# Patient Record
Sex: Male | Born: 1945 | Race: Black or African American | Hispanic: No | Marital: Married | Smoking: Never smoker
Health system: Southern US, Community
[De-identification: ages and names within clinical notes are randomized; demographics above are authoritative.]

## PROBLEM LIST (undated history)

## (undated) DIAGNOSIS — N400 Enlarged prostate without lower urinary tract symptoms: Secondary | ICD-10-CM

## (undated) DIAGNOSIS — R55 Syncope and collapse: Secondary | ICD-10-CM

## (undated) DIAGNOSIS — N289 Disorder of kidney and ureter, unspecified: Secondary | ICD-10-CM

## (undated) DIAGNOSIS — R9431 Abnormal electrocardiogram [ECG] [EKG]: Secondary | ICD-10-CM

## (undated) DIAGNOSIS — K219 Gastro-esophageal reflux disease without esophagitis: Secondary | ICD-10-CM

## (undated) DIAGNOSIS — D496 Neoplasm of unspecified behavior of brain: Secondary | ICD-10-CM

## (undated) DIAGNOSIS — F0390 Unspecified dementia without behavioral disturbance: Secondary | ICD-10-CM

## (undated) DIAGNOSIS — R001 Bradycardia, unspecified: Secondary | ICD-10-CM

## (undated) DIAGNOSIS — R569 Unspecified convulsions: Secondary | ICD-10-CM

## (undated) DIAGNOSIS — E119 Type 2 diabetes mellitus without complications: Secondary | ICD-10-CM

## (undated) DIAGNOSIS — I639 Cerebral infarction, unspecified: Secondary | ICD-10-CM

## (undated) DIAGNOSIS — K5900 Constipation, unspecified: Secondary | ICD-10-CM

## (undated) DIAGNOSIS — D573 Sickle-cell trait: Secondary | ICD-10-CM

## (undated) DIAGNOSIS — R1310 Dysphagia, unspecified: Secondary | ICD-10-CM

## (undated) DIAGNOSIS — I10 Essential (primary) hypertension: Secondary | ICD-10-CM

## (undated) DIAGNOSIS — E785 Hyperlipidemia, unspecified: Secondary | ICD-10-CM

## (undated) DIAGNOSIS — N189 Chronic kidney disease, unspecified: Secondary | ICD-10-CM

## (undated) HISTORY — PX: CRANIOTOMY FOR TUMOR: SUR345

## (undated) HISTORY — DX: Gastro-esophageal reflux disease without esophagitis: K21.9

## (undated) HISTORY — PX: CARDIAC CATHETERIZATION: SHX172

## (undated) HISTORY — DX: Type 2 diabetes mellitus without complications: E11.9

## (undated) HISTORY — DX: Dysphagia, unspecified: R13.10

## (undated) HISTORY — DX: Constipation, unspecified: K59.00

---

## 2014-04-22 ENCOUNTER — Emergency Department (HOSPITAL_COMMUNITY): Payer: Medicare Other

## 2014-04-22 ENCOUNTER — Encounter (HOSPITAL_COMMUNITY): Payer: Self-pay | Admitting: Emergency Medicine

## 2014-04-22 ENCOUNTER — Emergency Department (HOSPITAL_COMMUNITY)
Admission: EM | Admit: 2014-04-22 | Discharge: 2014-04-22 | Disposition: A | Discharge status: Home / Self Care | Payer: Medicare Other | Attending: Emergency Medicine | Admitting: Emergency Medicine

## 2014-04-22 DIAGNOSIS — Z85841 Personal history of malignant neoplasm of brain: Secondary | ICD-10-CM | POA: Diagnosis not present

## 2014-04-22 DIAGNOSIS — E119 Type 2 diabetes mellitus without complications: Secondary | ICD-10-CM | POA: Diagnosis not present

## 2014-04-22 DIAGNOSIS — Z794 Long term (current) use of insulin: Secondary | ICD-10-CM | POA: Diagnosis not present

## 2014-04-22 DIAGNOSIS — R001 Bradycardia, unspecified: Secondary | ICD-10-CM | POA: Diagnosis not present

## 2014-04-22 DIAGNOSIS — Z8673 Personal history of transient ischemic attack (TIA), and cerebral infarction without residual deficits: Secondary | ICD-10-CM | POA: Insufficient documentation

## 2014-04-22 DIAGNOSIS — Z87448 Personal history of other diseases of urinary system: Secondary | ICD-10-CM | POA: Diagnosis not present

## 2014-04-22 DIAGNOSIS — Z9889 Other specified postprocedural states: Secondary | ICD-10-CM | POA: Insufficient documentation

## 2014-04-22 DIAGNOSIS — R079 Chest pain, unspecified: Secondary | ICD-10-CM

## 2014-04-22 DIAGNOSIS — Z880 Allergy status to penicillin: Secondary | ICD-10-CM | POA: Insufficient documentation

## 2014-04-22 DIAGNOSIS — I10 Essential (primary) hypertension: Secondary | ICD-10-CM | POA: Insufficient documentation

## 2014-04-22 DIAGNOSIS — Z79899 Other long term (current) drug therapy: Secondary | ICD-10-CM | POA: Insufficient documentation

## 2014-04-22 DIAGNOSIS — F0390 Unspecified dementia without behavioral disturbance: Secondary | ICD-10-CM | POA: Diagnosis not present

## 2014-04-22 HISTORY — DX: Cerebral infarction, unspecified: I63.9

## 2014-04-22 HISTORY — DX: Abnormal electrocardiogram (ECG) (EKG): R94.31

## 2014-04-22 HISTORY — DX: Hyperlipidemia, unspecified: E78.5

## 2014-04-22 HISTORY — DX: Syncope and collapse: R55

## 2014-04-22 HISTORY — DX: Essential (primary) hypertension: I10

## 2014-04-22 HISTORY — DX: Neoplasm of unspecified behavior of brain: D49.6

## 2014-04-22 HISTORY — DX: Unspecified dementia, unspecified severity, without behavioral disturbance, psychotic disturbance, mood disturbance, and anxiety: F03.90

## 2014-04-22 HISTORY — DX: Disorder of kidney and ureter, unspecified: N28.9

## 2014-04-22 HISTORY — DX: Bradycardia, unspecified: R00.1

## 2014-04-22 HISTORY — DX: Type 2 diabetes mellitus without complications: E11.9

## 2014-04-22 LAB — BASIC METABOLIC PANEL
Anion gap: 7 (ref 5–15)
BUN: 17 mg/dL (ref 6–23)
CALCIUM: 9.6 mg/dL (ref 8.4–10.5)
CO2: 30 mEq/L (ref 19–32)
Chloride: 110 mEq/L (ref 96–112)
Creatinine, Ser: 1.28 mg/dL (ref 0.50–1.35)
GFR, EST AFRICAN AMERICAN: 64 mL/min — AB (ref >90)
GFR, EST NON AFRICAN AMERICAN: 55 mL/min — AB (ref >90)
GLUCOSE: 64 mg/dL — AB (ref 70–99)
POTASSIUM: 4.2 meq/L (ref 3.7–5.3)
SODIUM: 147 meq/L (ref 137–147)

## 2014-04-22 LAB — CBC
HCT: 29.2 % — ABNORMAL LOW (ref 39.0–52.0)
Hemoglobin: 10 g/dL — ABNORMAL LOW (ref 13.0–17.0)
MCH: 27.5 pg (ref 26.0–34.0)
MCHC: 34.2 g/dL (ref 30.0–36.0)
MCV: 80.2 fL (ref 78.0–100.0)
PLATELETS: 90 10*3/uL — AB (ref 150–400)
RBC: 3.64 MIL/uL — ABNORMAL LOW (ref 4.22–5.81)
RDW: 13 % (ref 11.5–15.5)
WBC: 3.2 10*3/uL — ABNORMAL LOW (ref 4.0–10.5)

## 2014-04-22 LAB — I-STAT TROPONIN, ED: TROPONIN I, POC: 0.01 ng/mL (ref 0.00–0.08)

## 2014-04-22 LAB — CBG MONITORING, ED: Glucose-Capillary: 111 mg/dL — ABNORMAL HIGH (ref 70–99)

## 2014-04-22 NOTE — Discharge Instructions (Signed)
Bradycardia °Bradycardia is a term for a heart rate (pulse) that, in adults, is slower than 60 beats per minute. A normal rate is 60 to 100 beats per minute. A heart rate below 60 beats per minute may be normal for some adults with healthy hearts. If the rate is too slow, the heart may have trouble pumping the volume of blood the body needs. If the heart rate gets too low, blood flow to the brain may be decreased and may make you feel lightheaded, dizzy, or faint. °The heart has a natural pacemaker in the top of the heart called the SA node (sinoatrial or sinus node). This pacemaker sends out regular electrical signals to the muscle of the heart, telling the heart muscle when to beat (contract). The electrical signal travels from the upper parts of the heart (atria) through the AV node (atrioventricular node), to the lower chambers of the heart (ventricles). The ventricles squeeze, pumping the blood from your heart to your lungs and to the rest of your body. °CAUSES  °· Problem with the heart's electrical system. °· Problem with the heart's natural pacemaker. °· Heart disease, damage, or infection. °· Medications. °· Problems with minerals and salts (electrolytes). °SYMPTOMS  °· Fainting (syncope). °· Fatigue and weakness. °· Shortness of breath (dyspnea). °· Chest pain (angina). °· Drowsiness. °· Confusion. °DIAGNOSIS  °· An electrocardiogram (ECG) can help your caregiver determine the type of slow heart rate you have. °· If the cause is not seen on an ECG, you may need to wear a heart monitor that records your heart rhythm for several hours or days. °· Blood tests. °TREATMENT  °· Electrolyte supplements. °· Medications. °· Withholding medication which is causing a slow heart rate. °· Pacemaker placement. °SEEK IMMEDIATE MEDICAL CARE IF:  °· You feel lightheaded or faint. °· You develop an irregular heart rate. °· You feel chest pain or have trouble breathing. °MAKE SURE YOU:  °· Understand these  instructions. °· Will watch your condition. °· Will get help right away if you are not doing well or get worse. °Document Released: 03/02/2002 Document Revised: 09/02/2011 Document Reviewed: 09/15/2013 °[employer]® Patient Information ©2015 [employer]. This information is not intended to replace advice given to you by your health care provider. Make sure you discuss any questions you have with your health care provider. ° °

## 2014-04-22 NOTE — ED Provider Notes (Signed)
CSN: [id number]     Arrival date & time 04/22/14  1012 History   First MD Initiated Contact with Patient 04/22/14 1033     Chief Complaint  Patient presents with  . Chest Pain     (Consider location/radiation/quality/duration/timing/severity/associated sxs/prior Treatment) Patient is a 68 y.o. male presenting with chest pain. The history is provided by the spouse.  Chest Pain   Jeffery Schneider is a 68 y.o. male who presents for evaluation of an episode of dizziness. The patient has these symptoms at least twice a week. Sometimes they are accompanied by low heart rate. He saw his doctor 2 days ago, and was put on a cardiac monitor to evaluate the episodes. This morning he also appeared to have chest pain. Chest pain has apparently since, resolved. This information is gained from his wife, who gives the entire history. The patient is not able to contribute to history. He has had a left brain hemorrhagic stroke. The patient is also troubled by "fatigue" that has been going on for a couple of weeks. Sometimes when he is having a dizzy spell. He gets even more fatigued. He reportedly took all his medications, this morning as directed. There have been no other recent events. There are no other known modifying factors.   Past Medical History  Diagnosis Date  . Hypertension   . Diabetes mellitus without complication   . Brain tumor   . Stroke   . Kidney disease   . Hyperlipidemia   . Bradycardia   . Syncope   . Abnormal EKG   . Dementia    Past Surgical History  Procedure Laterality Date  . Cardiac catheterization    . Craniotomy for tumor      frontal lobe   No family history on file. History  Substance Use Topics  . Smoking status: Never Smoker   . Smokeless tobacco: Not on file  . Alcohol Use: No    Review of Systems  Cardiovascular: Positive for chest pain.  All other systems reviewed and are negative.     Allergies  Ace inhibitors; Calcitonin; Influenza vaccines;  Lisinopril; Loratadine; Penicillins; Sulfa antibiotics; Actos; Codeine phosphate; and Streptomycin  Home Medications   Prior to Admission medications   Medication Sig Start Date End Date Taking? Authorizing Provider  insulin aspart (NOVOLOG) 100 UNIT/ML injection Inject 6-7 Units into the skin 3 (three) times daily before meals. Take 7 units every morning and 6 units at lunch and bedtime   Yes Historical Provider, MD  insulin glargine (LANTUS) 100 UNIT/ML injection Inject 12 Units into the skin at bedtime.   Yes Historical Provider, MD  levETIRAcetam (KEPPRA) 500 MG tablet Take 1,000 mg by mouth 2 (two) times daily.   Yes Historical Provider, MD  losartan (COZAAR) 100 MG tablet Take 100 mg by mouth daily.   Yes Historical Provider, MD  magnesium oxide (MAG-OX) 400 MG tablet Take 800 mg by mouth 2 (two) times daily.   Yes Historical Provider, MD  Melatonin 5 MG TABS Take 10 mg by mouth at bedtime.   Yes Historical Provider, MD  sertraline (ZOLOFT) 25 MG tablet Take 25 mg by mouth at bedtime.   Yes Historical Provider, MD  terazosin (HYTRIN) 2 MG capsule Take 2 mg by mouth at bedtime.   Yes Historical Provider, MD   BP 130/52 mmHg  Pulse 45  Temp(Src) 97.8 F (36.6 C) (Oral)  Resp 13  Ht 5\' 10"  (1.778 m)  Wt 177 lb (80.287 kg)  BMI 25.40  kg/m2  SpO2 98% Physical Exam  Nursing note and vitals reviewed. Constitutional: He appears well-developed and well-nourished.  HENT:  Head: Normocephalic.  Right Ear: External ear normal.  Left Ear: External ear normal.  Prior craniotomy scar is present. Left side  Eyes: Conjunctivae and EOM are normal. Pupils are equal, round, and reactive to light.  Neck: Normal range of motion and phonation normal. Neck supple.  Cardiovascular: Normal rate, regular rhythm and normal heart sounds.   Pulmonary/Chest: Effort normal and breath sounds normal. He exhibits no bony tenderness.  Abdominal: Soft. There is no tenderness.  Musculoskeletal: Normal range of  motion.  Neurological: He is alert. No cranial nerve deficit or sensory deficit. He exhibits normal muscle tone.  Nonsensical speech. He is alert, and cooperative. He is able to do strength testing. The strength is symmetric, bilaterally 3-4 over 5.   Skin: Skin is warm, dry and intact.  Psychiatric: He has a normal mood and affect. His behavior is normal.    ED Course  Procedures (including critical care time) Medications - No data to display  No data found.  External HR monitor was interrogated by contacting the vendor- 2 episodes of bradycardia in mid-50's without other arrhythymia  I discussed the case with Dr. Einar Gip, his cardiologist; no further evaluation or treatment suggested.   Labs Review Labs Reviewed  CBC - Abnormal; Notable for the following:    WBC 3.2 (*)    RBC 3.64 (*)    Hemoglobin 10.0 (*)    HCT 29.2 (*)    Platelets 90 (*)    All other components within normal limits  BASIC METABOLIC PANEL - Abnormal; Notable for the following:    Glucose, Bld 64 (*)    GFR calc non Af Amer 55 (*)    GFR calc Af Amer 64 (*)    All other components within normal limits  CBG MONITORING, ED - Abnormal; Notable for the following:    Glucose-Capillary 111 (*)    All other components within normal limits  I-STAT TROPOININ, ED    Imaging Review No results found.   EKG Interpretation   Date/Time:  Friday April 22 2014 10:15:06 EDT Ventricular Rate:  54 PR Interval:  182 QRS Duration: 84 QT Interval:  431 QTC Calculation: 408 R Axis:   -24 Text Interpretation:  Sinus rhythm Atrial premature complexes Borderline  left axis deviation No old tracing to compare Confirmed by [facility]  MD,  Shandra Szymborski ([id number]) on 04/22/2014 10:35:27 AM      MDM   Final diagnoses:  Bradycardia    Malaise with bradycardia. No SBI, metabolic instability or impending vascular collapse.  Nursing Notes Reviewed/ Care Coordinated Applicable Imaging Reviewed Interpretation of Laboratory  Data incorporated into ED treatment  The patient appears reasonably screened and/or stabilized for discharge and I doubt any other medical condition or other [facility] requiring further screening, evaluation, or treatment in the ED at this time prior to discharge.  Plan: Home Medications- usual; Home Treatments- rest; return here if the recommended treatment, does not improve the symptoms; Recommended follow up- PCP and Cardiology as usual    Richarda Blade, MD 04/24/14 1444

## 2014-04-22 NOTE — ED Notes (Addendum)
Waiting on transport for xray.

## 2014-04-22 NOTE — ED Notes (Signed)
Patient transported to X-ray 

## 2014-04-22 NOTE — ED Notes (Signed)
Per [facility], pt was eating breakfast this morning and started having epigastric pain that felt like a pressure. Was nauseated, no other symptoms. Pt not rating pain. Denies any pain while in route with EMS and at present. Not given any NTG or ASA. No ASA d/t brain tumor, and refused IV and NTG. Pt with hx of dementia.

## 2014-04-22 NOTE — ED Notes (Signed)
Patient returned to room, placed on monitor

## 2014-04-22 NOTE — ED Notes (Signed)
Dr. Wentz at the bedside.  

## 2014-05-18 ENCOUNTER — Encounter: Payer: Self-pay | Admitting: Gastroenterology

## 2014-07-19 ENCOUNTER — Ambulatory Visit (INDEPENDENT_AMBULATORY_CARE_PROVIDER_SITE_OTHER): Payer: 59 | Admitting: Gastroenterology

## 2014-07-19 ENCOUNTER — Encounter: Payer: Self-pay | Admitting: Gastroenterology

## 2014-07-19 DIAGNOSIS — R079 Chest pain, unspecified: Secondary | ICD-10-CM | POA: Insufficient documentation

## 2014-07-19 DIAGNOSIS — R0789 Other chest pain: Secondary | ICD-10-CM

## 2014-07-19 DIAGNOSIS — R1310 Dysphagia, unspecified: Secondary | ICD-10-CM | POA: Insufficient documentation

## 2014-07-19 DIAGNOSIS — K5900 Constipation, unspecified: Secondary | ICD-10-CM | POA: Insufficient documentation

## 2014-07-19 DIAGNOSIS — E119 Type 2 diabetes mellitus without complications: Secondary | ICD-10-CM | POA: Insufficient documentation

## 2014-07-19 NOTE — Assessment & Plan Note (Signed)
Stage of may be representative of if occult these with deglutition.  A esophageal stricture should be ruled out.   Recommendations #1 upper endoscopy with dilation as indicated

## 2014-07-19 NOTE — Patient Instructions (Signed)

## 2014-07-19 NOTE — Assessment & Plan Note (Signed)
By history the patient has noncardiac chest pain.  This may be due to reflux or esophageal mucosal disease including Candida esophagitis.  Recommendations #1 continue Protonix #2 upper endoscopy

## 2014-07-19 NOTE — Assessment & Plan Note (Signed)
Tendons are fairly well-controlled with MiraLAX.

## 2014-07-19 NOTE — Progress Notes (Signed)
_                                                                                                                History of Present Illness:  [name] is a 69 year old Afro-American male with history of a meningioma, status post resection complicated by a hemorrhagic stroke, referred for evaluation of chest pain and possibly dysphagia.  History was obtained through the patient's spouse.  The patient is not very communicative.  He has had cognitive difficulties since his stroke.  He was seen at [facility] approximately 3 months ago for chest pain.  It was felt that pain was non-cardiac and he was placed on Protonix.  He's had occasional episodes of less severe pain since.  He has some difficulty with swallowing.  His wife is not certain whether this is dysphagia or problems with deglutition.  Weight has been stable.  Probably underwent colonoscopy in 2012.  He has had a gastrostomy tube in the past.  Lab in October, 2015 was pertinent for hemoglobin 10 and MCV 80.  He complains of constipation.   Past Medical History  Diagnosis Date  . Hypertension   . Diabetes mellitus without complication   . Brain tumor   . Stroke   . Kidney disease   . Hyperlipidemia   . Bradycardia   . Syncope   . Abnormal EKG   . Dementia   . GERD (gastroesophageal reflux disease)   . Dysphagia   . Diabetes   . Constipation    Past Surgical History  Procedure Laterality Date  . Cardiac catheterization    . Craniotomy for tumor      frontal lobe   family history includes Diabetes in his sister; Heart attack in his brother, father, and mother; Thyroid cancer in his sister. Current Outpatient Prescriptions  Medication Sig Dispense Refill  . donepezil (ARICEPT) 5 MG tablet Take 5 mg by mouth at bedtime.    . insulin aspart (NOVOLOG) 100 UNIT/ML injection Inject 6-7 Units into the skin 3 (three) times daily before meals. Take 7 units every morning and 6 units at lunch and bedtime    .  insulin glargine (LANTUS) 100 UNIT/ML injection Inject 12 Units into the skin at bedtime.    . levETIRAcetam (KEPPRA) 500 MG tablet Take 1,000 mg by mouth 2 (two) times daily.    Marland Kitchen losartan (COZAAR) 100 MG tablet Take 100 mg by mouth daily.    . magnesium oxide (MAG-OX) 400 MG tablet Take 800 mg by mouth 2 (two) times daily.    . Melatonin 5 MG TABS Take 10 mg by mouth at bedtime.    . sertraline (ZOLOFT) 25 MG tablet Take 25 mg by mouth at bedtime.    Marland Kitchen terazosin (HYTRIN) 2 MG capsule Take 2 mg by mouth at bedtime.     No current facility-administered medications for this visit.   Allergies as of 07/19/2014 - Review Complete 07/19/2014  Allergen Reaction Noted  . Ace inhibitors  04/22/2014  .  Calcitonin  04/22/2014  . Influenza vaccines Nausea And Vomiting 04/22/2014  . Lisinopril Other (See Comments) 04/22/2014  . Loratadine Itching 04/22/2014  . Penicillins Other (See Comments) 04/22/2014  . Sulfa antibiotics Other (See Comments) 04/22/2014  . Actos [pioglitazone] Itching and Rash 04/22/2014  . Codeine phosphate Rash 04/22/2014  . Streptomycin Rash 04/22/2014    reports that he has never smoked. He does not have any smokeless tobacco history on file. He reports that he does not drink alcohol or use illicit drugs.   Review of Systems: He has lower extremity weakness and is not ambulatory Pertinent positive and negative review of systems were noted in the above HPI section. All other review of systems were otherwise negative.  Vital signs were reviewed in today's medical record Physical Exam: General: Well developed , well nourished, no acute distress Skin: anicteric Head: Normocephalic and atraumatic Eyes:  sclerae anicteric, EOMI Ears: Normal auditory acuity Mouth: No deformity or lesions Neck: Supple, no masses or thyromegaly Lungs: Clear throughout to auscultation Heart: Regular rate and rhythm; no  rubs or bruits.  There is a 1/6 early systolic murmur Abdomen: Soft, non  tender and non distended. No masses, hepatosplenomegaly or hernias noted. Normal Bowel sounds Rectal:deferred Musculoskeletal: Symmetrical with no gross deformities  Skin: No lesions on visible extremities Pulses:  Normal pulses noted Extremities: No clubbing, cyanosis, edema or deformities noted Neurological: Alert oriented x 4, grossly nonfocal Cervical Nodes:  No significant cervical adenopathy Inguinal Nodes: No significant inguinal adenopathy Psychological:  Alert and cooperative. Normal mood and affect  See Assessment and Plan under Problem List

## 2014-08-01 ENCOUNTER — Encounter: Payer: TRICARE For Life (TFL) | Admitting: Gastroenterology

## 2014-11-23 ENCOUNTER — Observation Stay (HOSPITAL_COMMUNITY)
Admission: EM | Admit: 2014-11-23 | Discharge: 2014-11-24 | Disposition: A | Discharge status: Home / Self Care | Payer: Medicare PPO | Attending: Internal Medicine | Admitting: Internal Medicine

## 2014-11-23 ENCOUNTER — Observation Stay (HOSPITAL_COMMUNITY): Payer: Medicare PPO

## 2014-11-23 ENCOUNTER — Emergency Department (HOSPITAL_COMMUNITY): Payer: Medicare PPO

## 2014-11-23 ENCOUNTER — Encounter (HOSPITAL_COMMUNITY): Payer: Self-pay | Admitting: Family Medicine

## 2014-11-23 ENCOUNTER — Inpatient Hospital Stay (HOSPITAL_COMMUNITY): Payer: Medicare PPO

## 2014-11-23 DIAGNOSIS — Z888 Allergy status to other drugs, medicaments and biological substances status: Secondary | ICD-10-CM | POA: Diagnosis not present

## 2014-11-23 DIAGNOSIS — N189 Chronic kidney disease, unspecified: Secondary | ICD-10-CM | POA: Diagnosis not present

## 2014-11-23 DIAGNOSIS — R5383 Other fatigue: Secondary | ICD-10-CM

## 2014-11-23 DIAGNOSIS — Z8673 Personal history of transient ischemic attack (TIA), and cerebral infarction without residual deficits: Secondary | ICD-10-CM | POA: Insufficient documentation

## 2014-11-23 DIAGNOSIS — Z79899 Other long term (current) drug therapy: Secondary | ICD-10-CM | POA: Diagnosis not present

## 2014-11-23 DIAGNOSIS — Z885 Allergy status to narcotic agent status: Secondary | ICD-10-CM | POA: Insufficient documentation

## 2014-11-23 DIAGNOSIS — N183 Chronic kidney disease, stage 3 (moderate): Secondary | ICD-10-CM

## 2014-11-23 DIAGNOSIS — K5900 Constipation, unspecified: Secondary | ICD-10-CM | POA: Diagnosis present

## 2014-11-23 DIAGNOSIS — K219 Gastro-esophageal reflux disease without esophagitis: Secondary | ICD-10-CM | POA: Diagnosis not present

## 2014-11-23 DIAGNOSIS — E1129 Type 2 diabetes mellitus with other diabetic kidney complication: Secondary | ICD-10-CM

## 2014-11-23 DIAGNOSIS — I129 Hypertensive chronic kidney disease with stage 1 through stage 4 chronic kidney disease, or unspecified chronic kidney disease: Secondary | ICD-10-CM | POA: Diagnosis not present

## 2014-11-23 DIAGNOSIS — Z880 Allergy status to penicillin: Secondary | ICD-10-CM | POA: Insufficient documentation

## 2014-11-23 DIAGNOSIS — R001 Bradycardia, unspecified: Secondary | ICD-10-CM | POA: Insufficient documentation

## 2014-11-23 DIAGNOSIS — R5381 Other malaise: Secondary | ICD-10-CM

## 2014-11-23 DIAGNOSIS — I4891 Unspecified atrial fibrillation: Secondary | ICD-10-CM | POA: Diagnosis not present

## 2014-11-23 DIAGNOSIS — R569 Unspecified convulsions: Secondary | ICD-10-CM | POA: Diagnosis not present

## 2014-11-23 DIAGNOSIS — R1310 Dysphagia, unspecified: Secondary | ICD-10-CM

## 2014-11-23 DIAGNOSIS — G459 Transient cerebral ischemic attack, unspecified: Secondary | ICD-10-CM | POA: Insufficient documentation

## 2014-11-23 DIAGNOSIS — E1122 Type 2 diabetes mellitus with diabetic chronic kidney disease: Secondary | ICD-10-CM | POA: Diagnosis not present

## 2014-11-23 DIAGNOSIS — Z794 Long term (current) use of insulin: Secondary | ICD-10-CM | POA: Insufficient documentation

## 2014-11-23 DIAGNOSIS — E119 Type 2 diabetes mellitus without complications: Secondary | ICD-10-CM | POA: Diagnosis not present

## 2014-11-23 DIAGNOSIS — R531 Weakness: Secondary | ICD-10-CM

## 2014-11-23 DIAGNOSIS — Z882 Allergy status to sulfonamides status: Secondary | ICD-10-CM | POA: Diagnosis not present

## 2014-11-23 DIAGNOSIS — G40909 Epilepsy, unspecified, not intractable, without status epilepticus: Secondary | ICD-10-CM | POA: Diagnosis not present

## 2014-11-23 DIAGNOSIS — E785 Hyperlipidemia, unspecified: Secondary | ICD-10-CM | POA: Insufficient documentation

## 2014-11-23 DIAGNOSIS — I10 Essential (primary) hypertension: Secondary | ICD-10-CM | POA: Diagnosis not present

## 2014-11-23 DIAGNOSIS — F0390 Unspecified dementia without behavioral disturbance: Secondary | ICD-10-CM | POA: Diagnosis not present

## 2014-11-23 HISTORY — DX: Unspecified convulsions: R56.9

## 2014-11-23 LAB — COMPREHENSIVE METABOLIC PANEL
ALT: 25 U/L (ref 17–63)
AST: 22 U/L (ref 15–41)
Albumin: 3.7 g/dL (ref 3.5–5.0)
Alkaline Phosphatase: 74 U/L (ref 38–126)
Anion gap: 10 (ref 5–15)
BILIRUBIN TOTAL: 0.9 mg/dL (ref 0.3–1.2)
BUN: 18 mg/dL (ref 6–20)
CO2: 24 mmol/L (ref 22–32)
Calcium: 9.1 mg/dL (ref 8.9–10.3)
Chloride: 104 mmol/L (ref 101–111)
Creatinine, Ser: 1.79 mg/dL — ABNORMAL HIGH (ref 0.61–1.24)
GFR calc non Af Amer: 37 mL/min — ABNORMAL LOW (ref >60)
GFR, EST AFRICAN AMERICAN: 43 mL/min — AB (ref >60)
Glucose, Bld: 380 mg/dL — ABNORMAL HIGH (ref 65–99)
POTASSIUM: 4.1 mmol/L (ref 3.5–5.1)
Sodium: 138 mmol/L (ref 135–145)
Total Protein: 6.4 g/dL — ABNORMAL LOW (ref 6.5–8.1)

## 2014-11-23 LAB — CBC
HCT: 30.8 % — ABNORMAL LOW (ref 39.0–52.0)
Hemoglobin: 10.7 g/dL — ABNORMAL LOW (ref 13.0–17.0)
MCH: 27.1 pg (ref 26.0–34.0)
MCHC: 34.7 g/dL (ref 30.0–36.0)
MCV: 78 fL (ref 78.0–100.0)
PLATELETS: 88 10*3/uL — AB (ref 150–400)
RBC: 3.95 MIL/uL — AB (ref 4.22–5.81)
RDW: 13.2 % (ref 11.5–15.5)
WBC: 5.1 10*3/uL (ref 4.0–10.5)

## 2014-11-23 LAB — ETHANOL

## 2014-11-23 LAB — I-STAT CHEM 8, ED
BUN: 20 mg/dL (ref 6–20)
CREATININE: 1.8 mg/dL — AB (ref 0.61–1.24)
Calcium, Ion: 1.19 mmol/L (ref 1.13–1.30)
Chloride: 103 mmol/L (ref 101–111)
Glucose, Bld: 380 mg/dL — ABNORMAL HIGH (ref 65–99)
HEMATOCRIT: 34 % — AB (ref 39.0–52.0)
Hemoglobin: 11.6 g/dL — ABNORMAL LOW (ref 13.0–17.0)
POTASSIUM: 4.1 mmol/L (ref 3.5–5.1)
SODIUM: 139 mmol/L (ref 135–145)
TCO2: 21 mmol/L (ref 0–100)

## 2014-11-23 LAB — GLUCOSE, CAPILLARY
Glucose-Capillary: 399 mg/dL — ABNORMAL HIGH (ref 65–99)
Glucose-Capillary: 196 mg/dL — ABNORMAL HIGH (ref 65–99)
Glucose-Capillary: 94 mg/dL (ref 65–99)

## 2014-11-23 LAB — CBG MONITORING, ED: Glucose-Capillary: 354 mg/dL — ABNORMAL HIGH (ref 65–99)

## 2014-11-23 LAB — DIFFERENTIAL
Basophils Absolute: 0 10*3/uL (ref 0.0–0.1)
Basophils Relative: 0 % (ref 0–1)
EOS PCT: 3 % (ref 0–5)
Eosinophils Absolute: 0.2 10*3/uL (ref 0.0–0.7)
Lymphocytes Relative: 25 % (ref 12–46)
Lymphs Abs: 1.3 10*3/uL (ref 0.7–4.0)
MONOS PCT: 6 % (ref 3–12)
Monocytes Absolute: 0.3 10*3/uL (ref 0.1–1.0)
NEUTROS ABS: 3.3 10*3/uL (ref 1.7–7.7)
NEUTROS PCT: 66 % (ref 43–77)

## 2014-11-23 LAB — URINALYSIS, ROUTINE W REFLEX MICROSCOPIC
BILIRUBIN URINE: NEGATIVE
Ketones, ur: 15 mg/dL — AB
Leukocytes, UA: NEGATIVE
Nitrite: NEGATIVE
Protein, ur: 30 mg/dL — AB
SPECIFIC GRAVITY, URINE: 1.019 (ref 1.005–1.030)
UROBILINOGEN UA: 0.2 mg/dL (ref 0.0–1.0)
pH: 7 (ref 5.0–8.0)

## 2014-11-23 LAB — APTT: aPTT: 26 seconds (ref 24–37)

## 2014-11-23 LAB — URINE MICROSCOPIC-ADD ON

## 2014-11-23 LAB — RAPID URINE DRUG SCREEN, HOSP PERFORMED
AMPHETAMINES: NOT DETECTED
Barbiturates: NOT DETECTED
Benzodiazepines: NOT DETECTED
Cocaine: NOT DETECTED
Opiates: NOT DETECTED
Tetrahydrocannabinol: NOT DETECTED

## 2014-11-23 LAB — PROTIME-INR
INR: 1.12 (ref 0.00–1.49)
Prothrombin Time: 14.6 seconds (ref 11.6–15.2)

## 2014-11-23 LAB — I-STAT TROPONIN, ED: TROPONIN I, POC: 0 ng/mL (ref 0.00–0.08)

## 2014-11-23 MED ORDER — SERTRALINE HCL 50 MG PO TABS
50.0000 mg | ORAL_TABLET | Freq: Every day | ORAL | Status: DC
  Administered 2014-11-23: 50 mg via ORAL
  Filled 2014-11-23: qty 1

## 2014-11-23 MED ORDER — INSULIN ASPART 100 UNIT/ML ~~LOC~~ SOLN
0.0000 [IU] | Freq: Three times a day (TID) | SUBCUTANEOUS | Status: DC
  Administered 2014-11-23: 15 [IU] via SUBCUTANEOUS
  Administered 2014-11-24: 3 [IU] via SUBCUTANEOUS

## 2014-11-23 MED ORDER — LORAZEPAM 2 MG/ML IJ SOLN: 1.0000 mg | Freq: Once | INTRAMUSCULAR | Status: AC | PRN

## 2014-11-23 MED ORDER — ASPIRIN 81 MG PO CHEW
81.0000 mg | CHEWABLE_TABLET | Freq: Every day | ORAL | Status: DC
  Filled 2014-11-23: qty 1

## 2014-11-23 MED ORDER — TERAZOSIN HCL 2 MG PO CAPS
2.0000 mg | ORAL_CAPSULE | Freq: Every day | ORAL | Status: DC
  Administered 2014-11-23: 2 mg via ORAL
  Filled 2014-11-23 (×2): qty 1

## 2014-11-23 MED ORDER — STROKE: EARLY STAGES OF RECOVERY BOOK
Freq: Once | Status: AC
  Administered 2014-11-23: 19:00:00
  Filled 2014-11-23: qty 1

## 2014-11-23 MED ORDER — PANTOPRAZOLE SODIUM 40 MG PO TBEC
40.0000 mg | DELAYED_RELEASE_TABLET | Freq: Every day | ORAL | Status: DC
  Administered 2014-11-24: 40 mg via ORAL
  Filled 2014-11-23: qty 1

## 2014-11-23 MED ORDER — INSULIN GLARGINE 100 UNIT/ML ~~LOC~~ SOLN
12.0000 [IU] | Freq: Every day | SUBCUTANEOUS | Status: DC
  Administered 2014-11-23: 12 [IU] via SUBCUTANEOUS
  Filled 2014-11-23 (×2): qty 0.12

## 2014-11-23 MED ORDER — FINASTERIDE 5 MG PO TABS
5.0000 mg | ORAL_TABLET | Freq: Every day | ORAL | Status: DC
  Administered 2014-11-24: 5 mg via ORAL
  Filled 2014-11-23: qty 1

## 2014-11-23 MED ORDER — SODIUM CHLORIDE 0.9 % IV SOLN
INTRAVENOUS | Status: AC
  Administered 2014-11-23: 16:00:00 via INTRAVENOUS

## 2014-11-23 MED ORDER — POLYETHYLENE GLYCOL 3350 17 G PO PACK
17.0000 g | PACK | Freq: Every day | ORAL | Status: DC
  Administered 2014-11-23 – 2014-11-24 (×2): 17 g via ORAL
  Filled 2014-11-23 (×2): qty 1

## 2014-11-23 MED ORDER — HYDRALAZINE HCL 20 MG/ML IJ SOLN: 10.0000 mg | Freq: Four times a day (QID) | INTRAMUSCULAR | Status: DC | PRN

## 2014-11-23 MED ORDER — LEVETIRACETAM 750 MG PO TABS
750.0000 mg | ORAL_TABLET | Freq: Three times a day (TID) | ORAL | Status: DC
  Administered 2014-11-23 – 2014-11-24 (×4): 750 mg via ORAL
  Filled 2014-11-23 (×4): qty 1

## 2014-11-23 MED ORDER — DOCUSATE SODIUM 100 MG PO CAPS
100.0000 mg | ORAL_CAPSULE | Freq: Two times a day (BID) | ORAL | Status: DC
  Administered 2014-11-23 – 2014-11-24 (×2): 100 mg via ORAL
  Filled 2014-11-23 (×2): qty 1

## 2014-11-23 NOTE — Procedures (Signed)
EEG report.  Brief clinical history: 69 y.o. male presenting to ED with transient AMS in setting of bradycardia. Symptoms resolved on arrival to ED.  Technique: this is a 17 channel routine scalp EEG performed at the bedside with bipolar and monopolar montages arranged in accordance to the international 10/20 system of electrode placement. One channel was dedicated to EKG recording.  No sleep was recorded. No activating procedures performed.  Description:In the wakeful state, the best background consisted of a medium amplitude, posterior dominant, well sustained, symmetric and reactive 8 Hz rhythm. No focal or generalized epileptiform discharges noted.  No pathologic areas of slowing seen.  EKG showed irregular rhythm.  Impression: this is a normal awake EEG. Please, be aware that a normal EEG does not exclude the possibility of epilepsy.  Clinical correlation is advised.   Dorian Pod, MD [facility]

## 2014-11-23 NOTE — H&P (Signed)
[facility] History and Physical  [name] DOB: 01-24-1946 DOA: 11/23/2014  Referring physician: Quincy Carnes. PCP: [name], MD   Chief Complaint: Period of Unresponsiveness.  HPI: Jeffery Schneider is a 69 y.o. male with a past medical history of seizures, hemorrhagic stroke, dementia, dysphagia, and meningioma resection who presents for a suspected seizure this morning. The history was provided by his wife. The patient's last known normal was this morning at 9:37. His wife states that he seemed agitated before she showered him at 9:45am. He was standing in the shower and was being assisted by his wife. His wife states that the patient's body began to slump, he grasped the towel rack and shower bar, was non-responsive, had a fixed gaze, and started to go down.  He then began vomiting, drooling, and loss bladder control. His wife took his pulse and states it was 30 bpm. The ambulance was called.  Per Jeffery Schneider his normal pulse rate is 45 - 50.   Jeffery Schneider for a retinopathy operation today, to correct increased pressure behind his eyes. The patient's wife denied shaking, loss of bowels, tongue biting, or missed doses of keppra.   He cannot have heparin or lovenox due to his hemorrhagic stroke.  Patient has been seen by neurology who recommended stroke and seizure workup.   Review of Systems:  Constitutional: No weight loss, night sweats, Fevers, chills, fatigue.  HEENT: No headaches, Difficulty swallowing,Tooth/dental problems,Sore throat,  No sneezing, itching, ear ache, nasal congestion, post nasal drip,  Cardio-vascular: No chest pain, Orthopnea, PND, swelling in lower extremities, anasarca, dizziness, palpitations  GI: No heartburn, indigestion, abdominal pain, nausea, vomiting, diarrhea, change in bowel habits, loss of appetite  Resp: No shortness of breath with exertion or at rest. No excess mucus, no productive cough, No non-productive cough, No  coughing up of blood.No change in color of mucus.No wheezing.No chest wall deformity  Skin: no rash or lesions.  GU: no dysuria, change in color of urine, no urgency or frequency. No flank pain.  Musculoskeletal: No joint pain or swelling. No decreased range of motion. No back pain.  Psych: No change in mood or affect. No depression or anxiety. No memory loss. His moods change day by day.  Past Medical History  Diagnosis Date  . Hypertension   . Diabetes mellitus without complication   . Brain tumor   . Stroke   . Kidney disease   . Hyperlipidemia   . Bradycardia   . Syncope   . Abnormal EKG   . Dementia   . GERD (gastroesophageal reflux disease)   . Dysphagia   . Diabetes   . Constipation   . Seizures    Past Surgical History  Procedure Laterality Date  . Cardiac catheterization    . Craniotomy for tumor      frontal lobe   Social History:  reports that he has never smoked. He does not have any smokeless tobacco history on file. He reports that he does not drink alcohol or use illicit drugs.   Lives at home with wife.  Needs assistance with ADLs.  Able to stand and walk.  Also has an aid that comes to help care for him daily.  Allergies  Allergen Reactions  . Ace Inhibitors   . Calcitonin   . Influenza Vaccines Nausea And Vomiting  . Lisinopril Other (See Comments)    Sees black spots  . Loratadine Itching  . Penicillins Other (See Comments)  Fainting.   . Sulfa Antibiotics Other (See Comments)    Fainting.   . Actos [Pioglitazone] Itching and Rash  . Codeine Rash  . Streptomycin Rash    Family History  Problem Relation Age of Onset  . Heart attack Mother   . Heart attack Father   . Thyroid cancer Sister   . Diabetes Sister   . Heart attack Brother     Prior to Admission medications   Medication Sig Start Date End Date Taking? Authorizing Provider  amLODipine (NORVASC) 10 MG tablet Take 10 mg by mouth daily.   Yes Historical Provider, MD   Cholecalciferol (VITAMIN D) 2000 UNITS CAPS Take 2,000 Units by mouth 2 (two) times a week.   Yes Historical Provider, MD  finasteride (PROSCAR) 5 MG tablet Take 5 mg by mouth daily. 11/17/14  Yes Historical Provider, MD  insulin aspart (NOVOLOG) 100 UNIT/ML injection Inject 6-7 Units into the skin 3 (three) times daily before meals. Sliding scale: 151-200=1 unit, 201-250=2 unit, 251-300=3 units, 301-350=4 units, 351-400=5 units, over 400 call dr   Yes Historical Provider, MD  insulin glargine (LANTUS) 100 UNIT/ML injection Inject 12 Units into the skin at bedtime.   Yes Historical Provider, MD  levETIRAcetam (KEPPRA) 750 MG tablet Take 750 mg by mouth 3 (three) times daily.   Yes Historical Provider, MD  losartan (COZAAR) 100 MG tablet Take 100 mg by mouth daily.   Yes Historical Provider, MD  magnesium oxide (MAG-OX) 400 MG tablet Take 800 mg by mouth 2 (two) times daily.   Yes Historical Provider, MD  Melatonin 2.5 MG CAPS Take 2.5 mg by mouth 2 (two) times daily.   Yes Historical Provider, MD  nystatin (MYCOSTATIN/NYSTOP) 100000 UNIT/GM POWD Apply to body daily after shower to remove moisture   Yes Historical Provider, MD  pantoprazole (PROTONIX) 40 MG tablet Take 40 mg by mouth daily.   Yes Historical Provider, MD  sertraline (ZOLOFT) 100 MG tablet Take 50 mg by mouth at bedtime.   Yes Historical Provider, MD  terazosin (HYTRIN) 2 MG capsule Take 2 mg by mouth at bedtime.   Yes Historical Provider, MD   Physical Exam: Filed Vitals:   11/23/14 1415 11/23/14 1424 11/23/14 1430 11/23/14 1545  BP: 155/75 155/75 155/70 167/61  Pulse: 66 77 52   Temp:    97.5 F (36.4 C)  TempSrc:    Oral  Resp: 23 11 19 20   SpO2: 96% 100% 94% 97%    Wt Readings from Last 3 Encounters:  07/19/14 82.101 kg (181 lb)  04/22/14 80.287 kg (177 lb)    General:  Wd, Wn, Appears calm and comfortable, smiling.   Eyes: PERRL, normal lids, irises & conjunctiva ENT: grossly normal hearing, lips & tongue Neck: no  LAD, masses or thyromegaly Cardiovascular: RRR, no m/r/g. No LE edema. Telemetry: SR, no arrhythmias  Respiratory: CTA bilaterally, no w/r/r. Normal respiratory effort. Abdomen: soft, ntnd Skin: no rash or induration seen on limited exam Musculoskeletal: grossly normal tone BUE/BLE Psychiatric: Orientated to Person.  Not always orientated to place at baseline. Neurologic: Dependent on wife, Repeats back to me what I've said, but does not appear to be able to answer questions.          Labs on Admission:  Basic Metabolic Panel:  Recent Labs Lab 11/23/14 1040 11/23/14 1050  NA 138 139  K 4.1 4.1  CL 104 103  CO2 24  --   GLUCOSE 380* 380*  BUN 18 20  CREATININE  1.79* 1.80*  CALCIUM 9.1  --    Liver Function Tests:  Recent Labs Lab 11/23/14 1040  AST 22  ALT 25  ALKPHOS 74  BILITOT 0.9  PROT 6.4*  ALBUMIN 3.7   CBC:  Recent Labs Lab 11/23/14 1040 11/23/14 1050  WBC 5.1  --   NEUTROABS 3.3  --   HGB 10.7* 11.6*  HCT 30.8* 34.0*  MCV 78.0  --   PLT 88*  --     CBG:  Recent Labs Lab 11/23/14 1255  GLUCAP 354*    Radiological Exams on Admission: Ct Head Wo Contrast  11/23/2014   CLINICAL DATA:  Slurred speech and facial drooping  EXAM: CT HEAD WITHOUT CONTRAST  TECHNIQUE: Contiguous axial images were obtained from the base of the skull through the vertex without intravenous contrast.  COMPARISON:  Nov 15, 2014  FINDINGS: Patient is status post frontal craniotomy. Ventricles are prominent with ex vacuo phenomenon involving the anterior aspect the left lateral ventricle, stable. The sulci are within normal limits for age. There is no intracranial mass, hemorrhage, extra-axial fluid collection, or midline shift. There is evidence of prior infarct in the left frontal lobe, stable. There is mild small vessel disease in the centra semiovale bilaterally, stable. There is no new gray-white compartment lesion. No acute infarct apparent.  IMPRESSION: Status post frontal  craniotomy with encephalomalacia involving the left frontal lobe anteriorly. Periventricular small vessel disease is stable. No hemorrhage, mass, or acute appearing infarct. No new gray-white compartment lesions. Stable atrophy. Note that the middle cerebral arteries do not show significant increased attenuation in appears stable compared to recent prior study.  Critical Value/emergent results were called by telephone at the time of interpretation on 11/23/2014 at 10:59 am to Dr. Nicole Kindred, neurology , who verbally acknowledged these results.   Electronically Signed   By: Lowella Grip III M.D.   On: 11/23/2014 11:00    EKG: Sinus rhythm with PACs. Normal QT  Assessment/Plan Principal Problem:   Seizure Active Problems:   Dysphagia   Diabetes   Constipation   Weakness   CKD (chronic kidney disease)   Unresponsive Period Possible seizure versus TIA. Seen by neurology. EEG completed and found to be normal.  Neurology consulted. They recommend 81 mg aspirin and TIA workup. Patient is already on [place]. Will admit to observation for TIA workup including MR, echo, carotids, PT/OT/speech evaluations.  Dysphagia  Patient on chopped diet. Meds are crushed and placed in pure  Speech therapy evaluation pending.  Diabetes Mellitus  check hemoglobin A1c. Continue Lantus. Add sliding scale moderate.  Chronic kidney disease Uncertain of baseline creatinine. Hold ARB.  Hypertension Currently holding ARB and amlodipine to allow for permissive hypertension in case he has had a stroke. Hydralazine when necessary SBP greater than 200.  Bradycardia Normal heart rate is 45 - 50.  Will monitor on tele as his pulse reportedly dropped to 30 during the episode.  Hx of Brain Tumor and ICH.  In 2014.    Seen by Neurology.  Code Status: Full code. DVT Prophylaxis:  SCDs. Family Communication: Wife at bedside  Disposition Plan: home in 24-48 hours  Time spent: 60 minutes   Imogene Burn,  [place] [facility] Pager: [phone]

## 2014-11-23 NOTE — Progress Notes (Signed)
Patient with hx of dementia, confusion. Wife days she is unhappy with patient's diabetic diet and the food choices offered. Called Dr Dyann Kief for her to speak to him regarding diet concerns and they had a conversation. I called service response nutrition line to please have someone call wife and talk to her about food choices within his ordered diet that she feels patient would be most willing to eat. Service response told me they would call her back in the room and make a food plan with her to keep everyone healthy and happy.

## 2014-11-23 NOTE — Progress Notes (Signed)
Rounded in Jeffery Schneider's room with oncoming RN, Jeffery Schneider's daughter asked me could I put "something in the medical record that stated my mom tried to talk to the doctor and he wouldn't change his (Jeffery Schneider's) diet." I said i could not type anything stating that i heard the conversation because I did NOT, however, I can make a notation of what they say transpired in the conversation. Per daughter, Jeffery Schneider's wife Jeffery Schneider "told Dr Dyann Kief that Jeffery Schneider is a brittle diabetic and that sliding scale insulin does not work the same for him as other patients and that Dr Dyann Kief refused to change meal plan." I told them I would record what they had said but cannot verify it as i did not hear conversation.   Nursing will continue to check Jeffery Schneider's blood sugar as is ordered and assess Jeffery Schneider for hyper/hypoglycemic events

## 2014-11-23 NOTE — Evaluation (Signed)
Clinical/Bedside Swallow Evaluation Patient Details  Name: Jeffery Schneider MRN: [id number] Date of Birth: [birth year]  Today's Date: 11/23/2014 Time: SLP Start Time (ACUTE ONLY): 8889 SLP Stop Time (ACUTE ONLY): 1624 SLP Time Calculation (min) (ACUTE ONLY): 11 min  Past Medical History:  Past Medical History  Diagnosis Date  . Hypertension   . Diabetes mellitus without complication   . Brain tumor   . Stroke   . Kidney disease   . Hyperlipidemia   . Bradycardia   . Syncope   . Abnormal EKG   . Dementia   . GERD (gastroesophageal reflux disease)   . Dysphagia   . Diabetes   . Constipation   . Seizures    Past Surgical History:  Past Surgical History  Procedure Laterality Date  . Cardiac catheterization    . Craniotomy for tumor      frontal lobe   HPI:  69 y.o. male with hx of left frontal tumor with resection and subsequent hemorrhagic stroke brought to ED per EMS with AMS, emesis.  Pt failed RN stroke swallow screen due to hx dysphagia.  Hx includes chronic aphasia, word-finding deficits; dementia.     Assessment / Plan / Recommendation Clinical Impression  Pt presents with functional oropharyngeal swallow with no focal CN deficits; active mastication; brisk swallow response; no overt s/s of aspiration despite large, successive boluses of water using a straw.  Recommend resuming a regular diet with thin liquids; meds whole in water unless that method elicits coughing, in which case meds should be administered whole in puree.  Pt able to follow basic commands and responded to biographical questions accurately.  No SLP f/u for swallow is indicated.  D/W RN.    Aspiration Risk  Mild    Diet Recommendation Thin (regular diet)   Medication Administration: Whole meds with liquid    Other  Recommendations Oral Care Recommendations: Oral care BID     Swallow Study Prior Functional Status       General Date of Onset: 11/23/14 Other Pertinent Information: 69 y.o. male with hx  of left frontal tumor with resection and subsequent hemorrhagic stroke brought to ED per EMS with AMS, emesis.  Pt failed RN stroke swallow screen due to hx dysphagia.  Hx includes chronic aphasia, word-finding deficits; dementia.   Type of Study: Bedside swallow evaluation Diet Prior to this Study: NPO Temperature Spikes Noted: No Respiratory Status: Room air History of Recent Intubation: No Behavior/Cognition: Alert;Cooperative;Pleasant mood Oral Cavity - Dentition: Adequate natural dentition/normal for age Self-Feeding Abilities: Able to feed self Patient Positioning: Upright in bed Baseline Vocal Quality: Normal Volitional Cough: Strong Volitional Swallow: Able to elicit    Oral/Motor/Sensory Function Overall Oral Motor/Sensory Function: Appears within functional limits for tasks assessed   Ice Chips Ice chips: Within functional limits Presentation: Spoon   Thin Liquid Thin Liquid: Within functional limits Presentation: Cup;Straw    Nectar Thick Nectar Thick Liquid: Not tested   Honey Thick Honey Thick Liquid: Not tested   Puree Puree: Within functional limits Presentation: Self Fed   Solid   GO Functional Assessment Tool Used: clinical assessment Functional Limitations: Swallowing Swallow Current Status [phone]): At least 1 percent but less than 20 percent impaired, limited or restricted Swallow Goal Status [phone]): At least 1 percent but less than 20 percent impaired, limited or restricted Swallow Discharge Status [phone]): At least 1 percent but less than 20 percent impaired, limited or restricted  Solid: Within functional limits Presentation: Self Fed  Juan Quam Laurice 11/23/2014,4:33 PM

## 2014-11-23 NOTE — Consult Note (Signed)
Referring Physician:  Vanita Panda    Chief Complaint: Code stroke  HPI:                                                                                                                                         Jeffery Schneider is an 69 y.o. male with history of dementia, aphasia, dysphagia, meningioma resection, Afib but not a AC candidate due to history of ICH and seizure. Patietn was in shower today when his wife noted he suddenly started to stare forward and then had episode of emesis.  Although there was no jerking, he remained altered for a short period of time. She took him out of the shower and noted his pulse was in the 16'X and systolic BP 096'E.  EMS was called and pateitn was brought to hospital as a code stroke. On arrival, per wife, he seemed to have resolved.  There was question of possible new right NL fold decrease. tPA was not administered do to history of ICH and symptoms resolving.   Date last known well: Date: 11/23/2014 Time last known well: Time: 09:45 tPA Given: No: symptoms resolved and history of ICH Modified Rankin: Rankin Score=4    Past Medical History  Diagnosis Date  . Hypertension   . Diabetes mellitus without complication   . Brain tumor   . Stroke   . Kidney disease   . Hyperlipidemia   . Bradycardia   . Syncope   . Abnormal EKG   . Dementia   . GERD (gastroesophageal reflux disease)   . Dysphagia   . Diabetes   . Constipation   . Seizures     Past Surgical History  Procedure Laterality Date  . Cardiac catheterization    . Craniotomy for tumor      frontal lobe    Family History  Problem Relation Age of Onset  . Heart attack Mother   . Heart attack Father   . Thyroid cancer Sister   . Diabetes Sister   . Heart attack Brother    Social History:  reports that he has never smoked. He does not have any smokeless tobacco history on file. He reports that he does not drink alcohol or use illicit drugs.  Allergies:  Allergies  Allergen Reactions  .  Ace Inhibitors   . Calcitonin   . Influenza Vaccines Nausea And Vomiting  . Lisinopril Other (See Comments)    Sees black spots  . Loratadine Itching  . Penicillins Other (See Comments)    Fainting.   . Sulfa Antibiotics Other (See Comments)    Fainting.   . Actos [Pioglitazone] Itching and Rash  . Codeine Phosphate Rash  . Streptomycin Rash    Medications:  No current facility-administered medications for this encounter.   Current Outpatient Prescriptions  Medication Sig Dispense Refill  . donepezil (ARICEPT) 5 MG tablet Take 5 mg by mouth at bedtime.    . insulin aspart (NOVOLOG) 100 UNIT/ML injection Inject 6-7 Units into the skin 3 (three) times daily before meals. Take 7 units every morning and 6 units at lunch and bedtime    . insulin glargine (LANTUS) 100 UNIT/ML injection Inject 12 Units into the skin at bedtime.    . levETIRAcetam (KEPPRA) 500 MG tablet Take 1,000 mg by mouth 2 (two) times daily.    Marland Kitchen losartan (COZAAR) 100 MG tablet Take 100 mg by mouth daily.    . magnesium oxide (MAG-OX) 400 MG tablet Take 800 mg by mouth 2 (two) times daily.    . Melatonin 5 MG TABS Take 10 mg by mouth at bedtime.    . pantoprazole (PROTONIX) 40 MG tablet Take 40 mg by mouth daily.    . sertraline (ZOLOFT) 25 MG tablet Take 25 mg by mouth at bedtime.    Marland Kitchen terazosin (HYTRIN) 2 MG capsule Take 2 mg by mouth at bedtime.       ROS:                                                                                                                                       History obtained from wife  General ROS: negative for - chills, fatigue, fever, night sweats, weight gain or weight loss Psychological ROS: negative for - behavioral disorder, hallucinations, memory difficulties, mood swings or suicidal ideation Ophthalmic ROS: negative for - blurry vision, double vision, eye  pain or loss of vision ENT ROS: negative for - epistaxis, nasal discharge, oral lesions, sore throat, tinnitus or vertigo Allergy and Immunology ROS: negative for - hives or itchy/watery eyes Hematological and Lymphatic ROS: negative for - bleeding problems, bruising or swollen lymph nodes Endocrine ROS: negative for - galactorrhea, hair pattern changes, polydipsia/polyuria or temperature intolerance Respiratory ROS: negative for - cough, hemoptysis, shortness of breath or wheezing Cardiovascular ROS: negative for - chest pain, dyspnea on exertion, edema or irregular heartbeat Gastrointestinal ROS: negative for - abdominal pain, diarrhea, hematemesis, nausea/vomiting or stool incontinence Genito-Urinary ROS: negative for - dysuria, hematuria, incontinence or urinary frequency/urgency Musculoskeletal ROS: negative for - joint swelling or muscular weakness Neurological ROS: as noted in HPI Dermatological ROS: negative for rash and skin lesion changes  Neurologic Examination:  Blood pressure 150/64, pulse 59, temperature 97.7 F (36.5 C), temperature source Oral, resp. rate 13, SpO2 99 %.  HEENT-  Normocephalic, no lesions, without obvious abnormality.  Normal external eye and conjunctiva.  Normal TM's bilaterally.  Normal auditory canals and external ears. Normal external nose, mucus membranes and septum.  Normal pharynx. Cardiovascular- S1, S2 normal, pulses palpable throughout   Lungs- chest clear, no wheezing, rales, normal symmetric air entry Abdomen- normal findings: bowel sounds normal Extremities- no edema Lymph-no adenopathy palpable Musculoskeletal-no joint tenderness, deformity or swelling Skin-warm and dry, no hyperpigmentation, vitiligo, or suspicious lesions  Neurological Examination Exam limited due to patients dementia and difficulty obeying commands.   Mental Status: Alert,  not oriented to place date or month (known dementia and this is his baseline).  Speech fluent with evidence of aphasia and word finding difficulty (baseline).  Only follows simple commands.  Cranial Nerves: II: Discs flat bilaterally; blinks to threat bilaterally, pupils equal, round, reactive to light and accommodation III,IV, VI: ptosis not present, extra-ocular motions intact bilaterally V,VII: smile symmetric with right NL fold decrease at baseline.  facial light touch sensation appears intact bilaterally VIII: hearing normal bilaterally IX,X: uvula rises symmetrically XI: bilateral shoulder shrug XII: midline tongue extension Motor: Moving all extremities antigravity with no unilateral weakness.  Sensory: Pinprick and light touch intact throughout, bilaterally Deep Tendon Reflexes: 2+ and symmetric throughout with no AJ Plantars: Right: downgoing   Left: downgoing Cerebellar: normal finger-to-nose,  heel-to-shin test unable to assess due to inability to understand commands.  Gait: not tested due to patient safety       Lab Results: Basic Metabolic Panel:  Recent Labs Lab 11/23/14 1050  NA 139  K 4.1  CL 103  GLUCOSE 380*  BUN 20  CREATININE 1.80*    Liver Function Tests: No results for input(s): AST, ALT, ALKPHOS, BILITOT, PROT, ALBUMIN in the last 168 hours. No results for input(s): LIPASE, AMYLASE in the last 168 hours. No results for input(s): AMMONIA in the last 168 hours.  CBC:  Recent Labs Lab 11/23/14 1050  HGB 11.6*  HCT 34.0*    Cardiac Enzymes: No results for input(s): CKTOTAL, CKMB, CKMBINDEX, TROPONINI in the last 168 hours.  Lipid Panel: No results for input(s): CHOL, TRIG, HDL, CHOLHDL, VLDL, LDLCALC in the last 168 hours.  CBG: No results for input(s): GLUCAP in the last 168 hours.  Microbiology: No results found for this or any previous visit.  Coagulation Studies: No results for input(s): LABPROT, INR in the last 72  hours.  Imaging: Ct Head Wo Contrast  11/23/2014   CLINICAL DATA:  Slurred speech and facial drooping  EXAM: CT HEAD WITHOUT CONTRAST  TECHNIQUE: Contiguous axial images were obtained from the base of the skull through the vertex without intravenous contrast.  COMPARISON:  Nov 15, 2014  FINDINGS: Patient is status post frontal craniotomy. Ventricles are prominent with ex vacuo phenomenon involving the anterior aspect the left lateral ventricle, stable. The sulci are within normal limits for age. There is no intracranial mass, hemorrhage, extra-axial fluid collection, or midline shift. There is evidence of prior infarct in the left frontal lobe, stable. There is mild small vessel disease in the centra semiovale bilaterally, stable. There is no new gray-white compartment lesion. No acute infarct apparent.  IMPRESSION: Status post frontal craniotomy with encephalomalacia involving the left frontal lobe anteriorly. Periventricular small vessel disease is stable. No hemorrhage, mass, or acute appearing infarct. No new gray-white compartment lesions. Stable atrophy. Note that  the middle cerebral arteries do not show significant increased attenuation in appears stable compared to recent prior study.  Critical Value/emergent results were called by telephone at the time of interpretation on 11/23/2014 at 10:59 am to Dr. Nicole Kindred, neurology , who verbally acknowledged these results.   Electronically Signed   By: Lowella Grip III M.D.   On: 11/23/2014 11:00    Etta Quill PA-C [facility] [phone]  11/23/2014, 11:08 AM   Assessment: 69 y.o. male presenting to ED with transient AMS in setting of bradycardia.  Symptoms resolved on arrival to ED. Given history of ICH and resolved symptoms he is not a tPA candidate. At this time differential includes TIA/CVA and possible seizure.   Stroke Risk Factors - atrial fibrillation, diabetes mellitus, hyperlipidemia and hypertension  Recommend: 1. HgbA1c,  fasting lipid panel 2. MRI, MRA  of the brain without contrast 3. PT consult, OT consult, Speech consult 4. Echocardiogram 5. Carotid dopplers 6. Prophylactic therapy-Antiplatelet med: Aspirin - dose 81 mg daily 7. Risk factor modification 8. Telemetry monitoring 9. Frequent neuro checks 10 NPO until passes stroke swallow screen 11. EEG  I personally participated in this patient's evaluation and management, including formulating above clinical impression and management recommendations.  Rush Farmer M.D. [facility] [phone]

## 2014-11-23 NOTE — ED Provider Notes (Signed)
CSN: [id number]     Arrival date & time 11/23/14  1038 History   First MD Initiated Contact with Patient 11/23/14 1043     Chief Complaint  Patient presents with  . Code Stroke     (Consider location/radiation/quality/duration/timing/severity/associated sxs/prior Treatment) HPI Patient presents as a code stroke. Patient is here with his wife who provides the history of present illness. Patient has a history of dementia, prior stroke, level V caveat. She reports that the patient was in his usual state of health until about 2 hours ago, 0845. At that time the patient was witnessed to have right facial droop, difficulty speaking, decreased interactivity, foaming at the mouth. Symptoms improved, without clear intervention. The patient can answer questions briefly, denies pain, denies weakness currently. Family states the patient's prior strokes left him with diminished orientation.  Past Medical History  Diagnosis Date  . Hypertension   . Diabetes mellitus without complication   . Brain tumor   . Stroke   . Kidney disease   . Hyperlipidemia   . Bradycardia   . Syncope   . Abnormal EKG   . Dementia   . GERD (gastroesophageal reflux disease)   . Dysphagia   . Diabetes   . Constipation   . Seizures    Past Surgical History  Procedure Laterality Date  . Cardiac catheterization    . Craniotomy for tumor      frontal lobe   Family History  Problem Relation Age of Onset  . Heart attack Mother   . Heart attack Father   . Thyroid cancer Sister   . Diabetes Sister   . Heart attack Brother    History  Substance Use Topics  . Smoking status: Never Smoker   . Smokeless tobacco: Not on file  . Alcohol Use: No    Review of Systems  Unable to perform ROS: Dementia      Allergies  Ace inhibitors; Calcitonin; Influenza vaccines; Lisinopril; Loratadine; Penicillins; Sulfa antibiotics; Actos; Codeine phosphate; and Streptomycin  Home Medications   Prior to Admission  medications   Medication Sig Start Date End Date Taking? Authorizing Provider  donepezil (ARICEPT) 5 MG tablet Take 5 mg by mouth at bedtime.    Historical Provider, MD  insulin aspart (NOVOLOG) 100 UNIT/ML injection Inject 6-7 Units into the skin 3 (three) times daily before meals. Take 7 units every morning and 6 units at lunch and bedtime    Historical Provider, MD  insulin glargine (LANTUS) 100 UNIT/ML injection Inject 12 Units into the skin at bedtime.    Historical Provider, MD  levETIRAcetam (KEPPRA) 500 MG tablet Take 1,000 mg by mouth 2 (two) times daily.    Historical Provider, MD  losartan (COZAAR) 100 MG tablet Take 100 mg by mouth daily.    Historical Provider, MD  magnesium oxide (MAG-OX) 400 MG tablet Take 800 mg by mouth 2 (two) times daily.    Historical Provider, MD  Melatonin 5 MG TABS Take 10 mg by mouth at bedtime.    Historical Provider, MD  pantoprazole (PROTONIX) 40 MG tablet Take 40 mg by mouth daily.    Historical Provider, MD  sertraline (ZOLOFT) 25 MG tablet Take 25 mg by mouth at bedtime.    Historical Provider, MD  terazosin (HYTRIN) 2 MG capsule Take 2 mg by mouth at bedtime.    Historical Provider, MD   There were no vitals taken for this visit. Physical Exam  Constitutional: He is oriented to person, place, and time. He  appears well-developed. No distress.  HENT:  Head: Normocephalic and atraumatic.  E/o prior craniotomy, patient's speech is clear, brief  Eyes: Conjunctivae and EOM are normal.  Cardiovascular: Normal rate and regular rhythm.   Pulmonary/Chest: Effort normal. No stridor. No respiratory distress.  Abdominal: He exhibits no distension.  Musculoskeletal: He exhibits no edema.  Neurological: He is alert and oriented to person, place, and time.  Speech is clear, brief Face is symmetric Patient tracks visually appropriately. Patient does not follow commands reliably, but does move all 4 extremities spontaneously, hold both upper and lower  extremities upright against gravity. No tremor, no seizure activity  Skin: Skin is warm and dry.  Psychiatric: His mood appears anxious. His speech is delayed. He is slowed and withdrawn. Cognition and memory are impaired.  Nursing note and vitals reviewed.   ED Course  Procedures (including critical care time) Labs Review Labs Reviewed  I-STAT CHEM 8, ED - Abnormal; Notable for the following:    Creatinine, Ser 1.80 (*)    Glucose, Bld 380 (*)    Hemoglobin 11.6 (*)    HCT 34.0 (*)    All other components within normal limits  ETHANOL  PROTIME-INR  APTT  CBC  DIFFERENTIAL  COMPREHENSIVE METABOLIC PANEL  URINE RAPID DRUG SCREEN (HOSP PERFORMED) NOT AT [facility]  URINALYSIS, ROUTINE W REFLEX MICROSCOPIC (NOT AT [facility])  I-STAT TROPOININ, ED    Imaging Review No results found.   EKG Interpretation   Date/Time:  Wednesday November 23 2014 10:54:36 EDT Ventricular Rate:  59 PR Interval:  180 QRS Duration: 92 QT Interval:  442 QTC Calculation: 438 R Axis:   23 Text Interpretation:  Sinus rhythm Atrial premature complex Baseline  wander in lead(s) II III aVF Sinus rhythm Artifact Premature atrial  complexes Abnormal ekg Confirmed by Carmin Muskrat  MD ([id number]) on  11/23/2014 11:01:26 AM     Cardiac 60 sinus normal Pulse ox 100% room air normal   I reviewed the results (including imaging as performed), agree with the interpretation  On repeat exam the patient appears calm, no evidence for facial asymmetry.  Initial labs notable for hyperglycemia, no anion gap. Neurology recommends admission for EEG, MRI, further evaluation, management. Given history of prior hemorrhagic stroke, patient is not a candidate for TPA, and his neurologic symptoms have resolved.   MDM   Patient history of prior hemorrhagic stroke, seizures now presents after an episode of change in cognition, seizure versus syncope. Patient was not a candidate for emergent intervention, and his symptoms  resolved. However, given the notable comorbidities, and the patient's history of stroke, dementia, he required admission for further evaluation, management.   Carmin Muskrat, MD 11/23/14 1311

## 2014-11-23 NOTE — ED Notes (Signed)
EEG at bedside.

## 2014-11-23 NOTE — ED Notes (Addendum)
Pt here for stroke like symptoms to include right facial droop and slurred speech. LSN 845 before taking shower. Pt hx of stroke and dementia. CBG 393. Sinus brady and hypertensive.sts pt had episode in the shower and was unresponsive and foaming at the mouth with vomiting. Also hx of seizure. ,

## 2014-11-23 NOTE — Progress Notes (Signed)
EEG completed, results pending. 

## 2014-11-23 NOTE — ED Notes (Signed)
Wife is attempting to pursuade pt to urinate in urinal

## 2014-11-23 NOTE — Code Documentation (Signed)
69yo male arriving to [facility] via [place] at 1038.  Patient ate breakfast this morning and wanted to take a shower per wife.  LKW 0845 prior to getting in the shower.  While in the shower his wife noticed him to "pass out" and assisted him out of the shower.  She checked his BP and cannot recall the measurement but does remember that his HR was 30.  She reports that he vomited and was drooling and not responding.  She called 911 at that time.  EMS activated Code Stroke for right facial droop and slurred speech.  Stroke team at the bedside on arrival.  Labs drawn and patient cleared by Dr. Venora Maples.  Patient to CT.  NIHSS 7, see documentation for details and code stroke times.  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  Patient also has dementia with inability to recall at times.  He needs assistance with ADLs and uses a walker.  Patient has a seizure d/o and takes Keppra.  Dr. Nicole Kindred at the bedside.  No acute stroke treatment at this time.  Patient remains in the window to treat with tPA until 1145 should symptoms worsen.  Bedside handoff with ED RN Jerene Pitch.

## 2014-11-23 NOTE — Progress Notes (Signed)
VASCULAR LAB PRELIMINARY  PRELIMINARY  PRELIMINARY  PRELIMINARY  Carotid duplex  completed.    Preliminary report:  Bilateral:  1-39% ICA stenosis.  Vertebral artery flow is antegrade.      Kyrstyn Greear, RVT 11/23/2014, 4:40 PM

## 2014-11-24 ENCOUNTER — Observation Stay (HOSPITAL_COMMUNITY): Payer: Medicare PPO

## 2014-11-24 DIAGNOSIS — G459 Transient cerebral ischemic attack, unspecified: Secondary | ICD-10-CM

## 2014-11-24 DIAGNOSIS — R531 Weakness: Secondary | ICD-10-CM

## 2014-11-24 DIAGNOSIS — R569 Unspecified convulsions: Secondary | ICD-10-CM

## 2014-11-24 DIAGNOSIS — G40909 Epilepsy, unspecified, not intractable, without status epilepticus: Secondary | ICD-10-CM | POA: Diagnosis not present

## 2014-11-24 LAB — LIPID PANEL
CHOLESTEROL: 237 mg/dL — AB (ref 0–200)
HDL: 64 mg/dL (ref >40)
LDL Cholesterol: 157 mg/dL — ABNORMAL HIGH (ref 0–99)
Total CHOL/HDL Ratio: 3.7 RATIO
Triglycerides: 80 mg/dL (ref <150)
VLDL: 16 mg/dL (ref 0–40)

## 2014-11-24 LAB — CBC
HEMATOCRIT: 29.2 % — AB (ref 39.0–52.0)
Hemoglobin: 10.1 g/dL — ABNORMAL LOW (ref 13.0–17.0)
MCH: 26.7 pg (ref 26.0–34.0)
MCHC: 34.6 g/dL (ref 30.0–36.0)
MCV: 77.2 fL — AB (ref 78.0–100.0)
PLATELETS: 100 10*3/uL — AB (ref 150–400)
RBC: 3.78 MIL/uL — AB (ref 4.22–5.81)
RDW: 13.3 % (ref 11.5–15.5)
WBC: 4.8 10*3/uL (ref 4.0–10.5)

## 2014-11-24 LAB — GLUCOSE, CAPILLARY
Glucose-Capillary: 155 mg/dL — ABNORMAL HIGH (ref 65–99)
Glucose-Capillary: 95 mg/dL (ref 65–99)

## 2014-11-24 LAB — BASIC METABOLIC PANEL
Anion gap: 7 (ref 5–15)
BUN: 14 mg/dL (ref 6–20)
CALCIUM: 9.3 mg/dL (ref 8.9–10.3)
CO2: 30 mmol/L (ref 22–32)
Chloride: 105 mmol/L (ref 101–111)
Creatinine, Ser: 1.34 mg/dL — ABNORMAL HIGH (ref 0.61–1.24)
GFR calc Af Amer: >60 mL/min (ref >60)
GFR calc non Af Amer: 52 mL/min — ABNORMAL LOW (ref >60)
Glucose, Bld: 163 mg/dL — ABNORMAL HIGH (ref 65–99)
Potassium: 3.9 mmol/L (ref 3.5–5.1)
Sodium: 142 mmol/L (ref 135–145)

## 2014-11-24 LAB — HEMOGLOBIN A1C
HEMOGLOBIN A1C: 9.8 % — AB (ref 4.8–5.6)
Mean Plasma Glucose: 235 mg/dL

## 2014-11-24 MED ORDER — SIMVASTATIN 20 MG PO TABS: 20.0000 mg | ORAL_TABLET | Freq: Every day | ORAL | Status: DC

## 2014-11-24 MED ORDER — ASPIRIN 81 MG PO CHEW
81.0000 mg | CHEWABLE_TABLET | Freq: Every day | ORAL | Status: DC
Start: 2014-11-24 — End: 2015-11-02

## 2014-11-24 MED ORDER — AMLODIPINE BESYLATE 5 MG PO TABS
5.0000 mg | ORAL_TABLET | Freq: Every day | ORAL | Status: DC
  Administered 2014-11-24: 5 mg via ORAL
  Filled 2014-11-24: qty 1

## 2014-11-24 NOTE — Progress Notes (Signed)
Pt's wife checked the patient's blood sugar.  This Probation officer informed by the patient's wife of blood sugar reading of 454.  Wife administered the patient 7 units of insulin before this writer could get to the room with coverage.  Will continue to monitor. Cori Razor, RN

## 2014-11-24 NOTE — Progress Notes (Signed)
STROKE TEAM PROGRESS NOTE   HISTORY Jeffery Schneider is an 69 y.o. male with history of dementia, aphasia, dysphagia, meningioma resection, Afib but not a AC candidate due to history of ICH and seizure. Patient was in shower today when his wife noted he suddenly started to stare forward and then had episode of emesis. Although there was no jerking, he remained altered for a short period of time. She took him out of the shower and noted his pulse was in the 00'Q and systolic BP 676'P. EMS was called and pateitn was brought to hospital as a code stroke. On arrival, per wife, he seemed to have resolved. There was question of possible new right NL fold decrease. tPA was not administered do to history of ICH and symptoms resolving. He was last known well 11/23/2014 at 09:45. Modified Rankin: Rankin Score=4. Patient was not administered TPA secondary to symptoms resolved and history of ICH. He was admitted to for further evaluation and treatment.   SUBJECTIVE (INTERVAL HISTORY) His wife is at the bedside.  Overall he feels his condition is rapidly worsening. Patient's wife informs me that he is had baseline dementia and had a left frontal hemorrhagic infarct in 2014 following meningioma resection surgery. He has had some gait apraxia and right leg weakness and stiffness and has several good days and bad days. He sees neurologist Dr. Harrie Foreman in [facility] and has not been tried on Aricept due to a history of bradycardia and several syncopal episodes.   OBJECTIVE Temp:  [97.3 F (36.3 C)-98.6 F (37 C)] 98.1 F (36.7 C) (06/02 0400) Pulse Rate:  [44-77] 50 (06/02 0400) Cardiac Rhythm:  [-] Sinus bradycardia;Normal sinus rhythm (06/01 2000) Resp:  [11-23] 16 (06/02 0400) BP: (116-169)/(46-93) 154/54 mmHg (06/02 0400) SpO2:  [94 %-100 %] 98 % (06/02 0400)   Recent Labs Lab 11/23/14 1255 11/23/14 1639 11/23/14 1950 11/23/14 2209 11/24/14 0631  GLUCAP 354* 399* 196* 94 155*    Recent Labs Lab  11/23/14 1040 11/23/14 1050 11/24/14 0546  NA 138 139 142  K 4.1 4.1 3.9  CL 104 103 105  CO2 24  --  30  GLUCOSE 380* 380* 163*  BUN 18 20 14   CREATININE 1.79* 1.80* 1.34*  CALCIUM 9.1  --  9.3    Recent Labs Lab 11/23/14 1040  AST 22  ALT 25  ALKPHOS 74  BILITOT 0.9  PROT 6.4*  ALBUMIN 3.7    Recent Labs Lab 11/23/14 1040 11/23/14 1050 11/24/14 0546  WBC 5.1  --  4.8  NEUTROABS 3.3  --   --   HGB 10.7* 11.6* 10.1*  HCT 30.8* 34.0* 29.2*  MCV 78.0  --  77.2*  PLT 88*  --  100*   No results for input(s): CKTOTAL, CKMB, CKMBINDEX, TROPONINI in the last 168 hours.  Recent Labs  11/23/14 1040  LABPROT 14.6  INR 1.12    Recent Labs  11/23/14 1515  COLORURINE YELLOW  LABSPEC 1.019  PHURINE 7.0  GLUCOSEU >1000*  HGBUR TRACE*  BILIRUBINUR NEGATIVE  KETONESUR 15*  PROTEINUR 30*  UROBILINOGEN 0.2  NITRITE NEGATIVE  LEUKOCYTESUR NEGATIVE       Component Value Date/Time   CHOL 237* 11/24/2014 0546   TRIG 80 11/24/2014 0546   HDL 64 11/24/2014 0546   CHOLHDL 3.7 11/24/2014 0546   VLDL 16 11/24/2014 0546   LDLCALC 157* 11/24/2014 0546   Lab Results  Component Value Date   HGBA1C 9.8* 11/23/2014  Component Value Date/Time   LABOPIA NONE DETECTED 11/23/2014 1513   COCAINSCRNUR NONE DETECTED 11/23/2014 1513   LABBENZ NONE DETECTED 11/23/2014 1513   AMPHETMU NONE DETECTED 11/23/2014 1513   THCU NONE DETECTED 11/23/2014 1513   LABBARB NONE DETECTED 11/23/2014 1513     Recent Labs Lab 11/23/14 1040  ETH <5    Ct Head Wo Contrast  11/23/2014   CLINICAL DATA:  Slurred speech and facial drooping  EXAM: CT HEAD WITHOUT CONTRAST  TECHNIQUE: Contiguous axial images were obtained from the base of the skull through the vertex without intravenous contrast.  COMPARISON:  Nov 15, 2014  FINDINGS: Patient is status post frontal craniotomy. Ventricles are prominent with ex vacuo phenomenon involving the anterior aspect the left lateral ventricle, stable.  The sulci are within normal limits for age. There is no intracranial mass, hemorrhage, extra-axial fluid collection, or midline shift. There is evidence of prior infarct in the left frontal lobe, stable. There is mild small vessel disease in the centra semiovale bilaterally, stable. There is no new gray-white compartment lesion. No acute infarct apparent.  IMPRESSION: Status post frontal craniotomy with encephalomalacia involving the left frontal lobe anteriorly. Periventricular small vessel disease is stable. No hemorrhage, mass, or acute appearing infarct. No new gray-white compartment lesions. Stable atrophy. Note that the middle cerebral arteries do not show significant increased attenuation in appears stable compared to recent prior study.  Critical Value/emergent results were called by telephone at the time of interpretation on 11/23/2014 at 10:59 am to Dr. Nicole Kindred, neurology , who verbally acknowledged these results.   Electronically Signed   By: Lowella Grip III M.D.   On: 11/23/2014 11:00   Mri Brain Without Contrast  11/23/2014   CLINICAL DATA:  TIA. Personal history of hemorrhagic stroke, seizure disorder, dysphagia, and meningioma resection. Episode of unresponsiveness.  EXAM: MRI HEAD WITHOUT CONTRAST  MRA HEAD WITHOUT CONTRAST  TECHNIQUE: Multiplanar, multiecho pulse sequences of the brain and surrounding structures were obtained without intravenous contrast. Angiographic images of the head were obtained using MRA technique without contrast.  COMPARISON:  CT head of same day.  FINDINGS: MRI HEAD FINDINGS  The diffusion-weighted images demonstrate no evidence for acute or subacute infarction. A remote hemorrhagic anterior left frontal lobe infarct is noted. There is ex vacuo dilation of the anterior left lateral ventricle. Mild generalized atrophy is present. Periventricular white matter changes are present on the right as well. No acute hemorrhage or mass lesion is present. Flow is present in the  major intracranial arteries. The globes and orbits are intact the paranasal sinuses and mastoid air cells are clear. Skullbase is within normal limits. Midline structures are unremarkable  MRA HEAD FINDINGS  Atherosclerotic changes are present within the cavernous internal carotid arteries bilaterally, more prominently on the left. There is no significant stenosis. The A1 and a segments are normal. Anterior communicating artery is patent. MCA branch scratch the the MCA bifurcations are within normal limits. ACA and MCA branch vessels are unremarkable.  The left vertebral artery is the dominant vessel. The PICA origins are visualized and normal bilaterally. Both posterior cerebral arteries originate from the basilar tip. Asymmetric attenuation of left PCA branch vessels is noted.  IMPRESSION: 1. Remote anterior left frontal lobe hemorrhagic infarct. 2. No acute intracranial abnormality. 3. Mild white matter disease. 4. Asymmetric attenuation of left PCA branch vessels suggesting distal stenosis.   Electronically Signed   By: San Morelle M.D.   On: 11/23/2014 19:54   Mr Jodene Nam Head/brain  Wo Cm  11/23/2014   CLINICAL DATA:  TIA. Personal history of hemorrhagic stroke, seizure disorder, dysphagia, and meningioma resection. Episode of unresponsiveness.  EXAM: MRI HEAD WITHOUT CONTRAST  MRA HEAD WITHOUT CONTRAST  TECHNIQUE: Multiplanar, multiecho pulse sequences of the brain and surrounding structures were obtained without intravenous contrast. Angiographic images of the head were obtained using MRA technique without contrast.  COMPARISON:  CT head of same day.  FINDINGS: MRI HEAD FINDINGS  The diffusion-weighted images demonstrate no evidence for acute or subacute infarction. A remote hemorrhagic anterior left frontal lobe infarct is noted. There is ex vacuo dilation of the anterior left lateral ventricle. Mild generalized atrophy is present. Periventricular white matter changes are present on the right as well.  No acute hemorrhage or mass lesion is present. Flow is present in the major intracranial arteries. The globes and orbits are intact the paranasal sinuses and mastoid air cells are clear. Skullbase is within normal limits. Midline structures are unremarkable  MRA HEAD FINDINGS  Atherosclerotic changes are present within the cavernous internal carotid arteries bilaterally, more prominently on the left. There is no significant stenosis. The A1 and a segments are normal. Anterior communicating artery is patent. MCA branch scratch the the MCA bifurcations are within normal limits. ACA and MCA branch vessels are unremarkable.  The left vertebral artery is the dominant vessel. The PICA origins are visualized and normal bilaterally. Both posterior cerebral arteries originate from the basilar tip. Asymmetric attenuation of left PCA branch vessels is noted.  IMPRESSION: 1. Remote anterior left frontal lobe hemorrhagic infarct. 2. No acute intracranial abnormality. 3. Mild white matter disease. 4. Asymmetric attenuation of left PCA branch vessels suggesting distal stenosis.   Electronically Signed   By: San Morelle M.D.   On: 11/23/2014 19:54   Carotid Doppler  There is 1-39% bilateral ICA stenosis. Vertebral artery flow is antegrade.    EEG normal awake EEG  PHYSICAL EXAM Frail elderly African-American male currently not in distress. . Afebrile. Head is nontraumatic. Neck is supple without bruit.    Cardiac exam no murmur or gallop. Lungs are clear to auscultation. Distal pulses are well felt. Neurological Exam : Awake alert oriented to person alone. No dysarthria or aphasia. Diminished attention, memory and recall. Poor insight. Follows only midline and simple one-step commands. Extraocular movements are full range without nystagmus. Blinks to threat bilaterally. Fundi were not visualized. Vision acuity seems adequate. Face is symmetric. Tongue is midline. Motor system exam able to move all 4 extremities  against gravity though there is increased tone and mild weakness of right lower extremity. Gait was not tested.  ASSESSMENT/PLAN Jeffery Schneider is a 69 y.o. male with history of dementia, aphasia, dysphagia, meningioma resection, Afib but not a AC candidate due to history of ICH and seizure presenting with staring episode. He did not receive IV t-PA due to symptoms resolved.   Seizure vs TIA  Resultant  no deficits. Baseline dementia and mild right leg weakness.  MRI  No acute stroke  MRA  Distal L PCA stenosis  Carotid Doppler  No significant stenosis   2D Echo  Left ventricle: The cavity size was normal. There was moderate concentric hypertrophy. Systolic function was vigorous. The estimated ejection fraction was in the range of 65% to 70%. Wall motion was normal; there were no regional wall motion abnormalities  EEG normal  LDL 157  HgbA1c 9.8  SCDs for VTE prophylaxis  Diet heart healthy/carb modified Room service appropriate?: Yes; Fluid consistency:: Thin  no antithrombotic prior to admission, now on aspirin 81 mg orally every day  Patient counseled to be compliant with his antithrombotic medications  Ongoing aggressive stroke risk factor management  Therapy recommendations:  Pending  Disposition:  Pending (lives w wife. Needs assist w/ ADLs. Has daily aide)  Essential Hypertension  Home meds: amlodipine 10 mg, Cozaar 100 mg, Hytrin 2 mg  Unstable  Patient counseled to be compliant with his blood pressure medications  Hyperlipidemia  Home meds:  No statin  LDL 157, goal < 70  Add lipitor 20 mg  Continue statin at discharge  Diabetes type II  HgbA1c 9.8, goal < 7.0  Uncontrolled  Other Stroke Risk Factors  Advanced age  Other Active Problems  Baseline dementia  Seizure on Keppra  Dysphagia  Chronic Kidney disease  Bradycardia  Other Pertinent History  Brain Tumor s/p frontal lobe crani 2014  Hospital day # 1 I have  personally examined this patient, reviewed notes, independently viewed imaging studies, participated in medical decision making and plan of care. I have made any additions or clarifications directly to the above note. He presented with transient staring and altered mental status in the setting of severe bradycardia and vomiting possibly a presyncopal event. Patient's wife did not notice any new focal neurological deficits apart from his baseline dementia and right leg weakness from his prior meningioma resection and left frontal hemorrhagic infarct. He remains at risk for recurrent seizures, TIAs, strokes, neurological worsening. Continue Keppra for seizures. Long discussion with the wife regarding his neurological presentation, results of the tests and answered questions. Continue aspirin for stroke prevention as he is not a long-term anticoagulation candidate due to history of brain hemorrhage.. Add Lipitor 20 mg daily for elevated lipids. And strict control of hypertension with blood pressure goal below 130/90 and diabetes with hemoglobin A1c goal below 6.5%. Continue medical workup for presyncope as indicated. Stroke service will sign off kindly call for questions. Antony Contras, MD Medical Director [facility] Pager: [phone] 11/24/2014 1:51 PM     To contact Stroke Continuity provider, please refer to [email or web address] After hours, contact General Neurology

## 2014-11-24 NOTE — Discharge Summary (Addendum)
Physician Discharge Summary  Jeffery Schneider ZTI:[id number] DOB: 01-19-1946 DOA: 11/23/2014  PCP: [name], MD  Admit date: 11/23/2014 Discharge date: 11/24/2014  Recommendations for Outpatient Follow-up:  1. Optimize diabetes control 2. Monitor LFTs on statin  Discharge Diagnoses:  Principal Problem:   Seizure Active Problems:   Dysphagia   Diabetes   Constipation   Weakness   CKD (chronic kidney disease)   TIA (transient ischemic attack)   Benign essential HTN   Discharge Condition: stab;e  Diet recommendation: heart healthy  There were no vitals filed for this visit.  History of present illness:  Jeffery Schneider is an 69 y.o. male with history of dementia, aphasia, dysphagia, meningioma resection, Afib but not a AC candidate due to history of ICH and seizure. Patietn was in shower today when his wife noted he suddenly started to stare forward and then had episode of emesis. Although there was no jerking, he remained altered for a short period of time. She took him out of the shower and noted his pulse was in the 82'N and systolic BP 053'Z. EMS was called and patient was brought to hospital as a code stroke. On arrival, per wife, he seemed to have resolved. There was question of possible new right NL fold decrease. tPA was not administered do to history of ICH and symptoms resolving.    Hospital Course:  Seizure vs TIA vs sycopal episod  Resultant no deficits. Baseline dementia and mild right leg weakness.  MRI No acute stroke  MRA Distal L PCA stenosis  Carotid Doppler No significant stenosis  2D Echo Left ventricle: The cavity size was normal. There was moderate concentric hypertrophy. Systolic function was vigorous. The estimated ejection fraction was in the range of 65% to 70%. Wall motion was normal; there were no regional wall motion abnormalities  EEG normal. On Keppra on admission  LDL 157. lipitor added  HgbA1c 9.8  Neuro recommend  continuing ASA  Hyperlipidemia  Home meds: No statin  LDL 157, goal < 70  Add lipitor 20 mg  Continue statin at discharge  Diabetes type II  HgbA1c 9.8, goal < 7.0  Needs to optimize control. Wife demanded regular diet, Rns had difficulty giving ordered insulin due to wifes demands. educated   Other Pertinent History  Brain Tumor s/p frontal lobe crani 2014  Procedures:  none  Consultations:  neurology  Discharge Exam: Filed Vitals:   11/24/14 1405  BP: 171/84  Pulse: 77  Temp: 97.9 F (36.6 C)  Resp: 15    General: alert. Confused and impulsive Cardiovascular: RRR Respiratory: CTA  Discharge Instructions   Discharge Instructions    Diet - low sodium heart healthy    Complete by:  As directed      Diet Carb Modified    Complete by:  As directed      Increase activity slowly    Complete by:  As directed           Current Discharge Medication List    START taking these medications   Details  aspirin 81 MG chewable tablet Chew 1 tablet (81 mg total) by mouth daily.    simvastatin (ZOCOR) 20 MG tablet Take 1 tablet (20 mg total) by mouth daily at 6 PM. Qty: 30 tablet, Refills: 1      CONTINUE these medications which have NOT CHANGED   Details  amLODipine (NORVASC) 10 MG tablet Take 10 mg by mouth daily.    Cholecalciferol (VITAMIN D) 2000 UNITS CAPS Take  2,000 Units by mouth 2 (two) times a week.    finasteride (PROSCAR) 5 MG tablet Take 5 mg by mouth daily. Refills: 11    insulin aspart (NOVOLOG) 100 UNIT/ML injection Inject 6-7 Units into the skin 3 (three) times daily before meals. Sliding scale: 151-200=1 unit, 201-250=2 unit, 251-300=3 units, 301-350=4 units, 351-400=5 units, over 400 call dr    insulin glargine (LANTUS) 100 UNIT/ML injection Inject 12 Units into the skin at bedtime.    levETIRAcetam (KEPPRA) 750 MG tablet Take 750 mg by mouth 3 (three) times daily.    losartan (COZAAR) 100 MG tablet Take 100 mg by mouth daily.     magnesium oxide (MAG-OX) 400 MG tablet Take 800 mg by mouth 2 (two) times daily.    Melatonin 2.5 MG CAPS Take 2.5 mg by mouth 2 (two) times daily.    nystatin (MYCOSTATIN/NYSTOP) 100000 UNIT/GM POWD Apply to body daily after shower to remove moisture    pantoprazole (PROTONIX) 40 MG tablet Take 40 mg by mouth daily.    sertraline (ZOLOFT) 100 MG tablet Take 50 mg by mouth at bedtime.    terazosin (HYTRIN) 2 MG capsule Take 2 mg by mouth at bedtime.       Allergies  Allergen Reactions  . Ace Inhibitors   . Calcitonin   . Coffee Bean Extract [Coffea Arabica]     Per daughter  . Influenza Vaccines Nausea And Vomiting  . Lisinopril Other (See Comments)    Sees black spots  . Loratadine Itching  . Penicillins Other (See Comments)    Fainting.   . Sulfa Antibiotics Other (See Comments)    Fainting.   . Actos [Pioglitazone] Itching and Rash  . Codeine Rash  . Streptomycin Rash   Follow-up Information    Follow up with [name], MD In 3 weeks.   Specialty:  Family Medicine   Contact information:   [address] [place] [address] [phone]        The results of significant diagnostics from this hospitalization (including imaging, microbiology, ancillary and laboratory) are listed below for reference.    Significant Diagnostic Studies: Ct Head Wo Contrast  11/23/2014   CLINICAL DATA:  Slurred speech and facial drooping  EXAM: CT HEAD WITHOUT CONTRAST  TECHNIQUE: Contiguous axial images were obtained from the base of the skull through the vertex without intravenous contrast.  COMPARISON:  Nov 15, 2014  FINDINGS: Patient is status post frontal craniotomy. Ventricles are prominent with ex vacuo phenomenon involving the anterior aspect the left lateral ventricle, stable. The sulci are within normal limits for age. There is no intracranial mass, hemorrhage, extra-axial fluid collection, or midline shift. There is evidence of  prior infarct in the left frontal lobe, stable. There is mild small vessel disease in the centra semiovale bilaterally, stable. There is no new gray-white compartment lesion. No acute infarct apparent.  IMPRESSION: Status post frontal craniotomy with encephalomalacia involving the left frontal lobe anteriorly. Periventricular small vessel disease is stable. No hemorrhage, mass, or acute appearing infarct. No new gray-white compartment lesions. Stable atrophy. Note that the middle cerebral arteries do not show significant increased attenuation in appears stable compared to recent prior study.  Critical Value/emergent results were called by telephone at the time of interpretation on 11/23/2014 at 10:59 am to Dr. Nicole Kindred, neurology , who verbally acknowledged these results.   Electronically Signed   By: Lowella Grip III M.D.   On: 11/23/2014 11:00  Mri Brain Without Contrast  11/23/2014   CLINICAL DATA:  TIA. Personal history of hemorrhagic stroke, seizure disorder, dysphagia, and meningioma resection. Episode of unresponsiveness.  EXAM: MRI HEAD WITHOUT CONTRAST  MRA HEAD WITHOUT CONTRAST  TECHNIQUE: Multiplanar, multiecho pulse sequences of the brain and surrounding structures were obtained without intravenous contrast. Angiographic images of the head were obtained using MRA technique without contrast.  COMPARISON:  CT head of same day.  FINDINGS: MRI HEAD FINDINGS  The diffusion-weighted images demonstrate no evidence for acute or subacute infarction. A remote hemorrhagic anterior left frontal lobe infarct is noted. There is ex vacuo dilation of the anterior left lateral ventricle. Mild generalized atrophy is present. Periventricular white matter changes are present on the right as well. No acute hemorrhage or mass lesion is present. Flow is present in the major intracranial arteries. The globes and orbits are intact the paranasal sinuses and mastoid air cells are clear. Skullbase is within normal limits.  Midline structures are unremarkable  MRA HEAD FINDINGS  Atherosclerotic changes are present within the cavernous internal carotid arteries bilaterally, more prominently on the left. There is no significant stenosis. The A1 and a segments are normal. Anterior communicating artery is patent. MCA branch scratch the the MCA bifurcations are within normal limits. ACA and MCA branch vessels are unremarkable.  The left vertebral artery is the dominant vessel. The PICA origins are visualized and normal bilaterally. Both posterior cerebral arteries originate from the basilar tip. Asymmetric attenuation of left PCA branch vessels is noted.  IMPRESSION: 1. Remote anterior left frontal lobe hemorrhagic infarct. 2. No acute intracranial abnormality. 3. Mild white matter disease. 4. Asymmetric attenuation of left PCA branch vessels suggesting distal stenosis.   Electronically Signed   By: San Morelle M.D.   On: 11/23/2014 19:54   Mr Jodene Nam Head/brain Wo Cm  11/23/2014   CLINICAL DATA:  TIA. Personal history of hemorrhagic stroke, seizure disorder, dysphagia, and meningioma resection. Episode of unresponsiveness.  EXAM: MRI HEAD WITHOUT CONTRAST  MRA HEAD WITHOUT CONTRAST  TECHNIQUE: Multiplanar, multiecho pulse sequences of the brain and surrounding structures were obtained without intravenous contrast. Angiographic images of the head were obtained using MRA technique without contrast.  COMPARISON:  CT head of same day.  FINDINGS: MRI HEAD FINDINGS  The diffusion-weighted images demonstrate no evidence for acute or subacute infarction. A remote hemorrhagic anterior left frontal lobe infarct is noted. There is ex vacuo dilation of the anterior left lateral ventricle. Mild generalized atrophy is present. Periventricular white matter changes are present on the right as well. No acute hemorrhage or mass lesion is present. Flow is present in the major intracranial arteries. The globes and orbits are intact the paranasal  sinuses and mastoid air cells are clear. Skullbase is within normal limits. Midline structures are unremarkable  MRA HEAD FINDINGS  Atherosclerotic changes are present within the cavernous internal carotid arteries bilaterally, more prominently on the left. There is no significant stenosis. The A1 and a segments are normal. Anterior communicating artery is patent. MCA branch scratch the the MCA bifurcations are within normal limits. ACA and MCA branch vessels are unremarkable.  The left vertebral artery is the dominant vessel. The PICA origins are visualized and normal bilaterally. Both posterior cerebral arteries originate from the basilar tip. Asymmetric attenuation of left PCA branch vessels is noted.  IMPRESSION: 1. Remote anterior left frontal lobe hemorrhagic infarct. 2. No acute intracranial abnormality. 3. Mild white matter disease. 4. Asymmetric attenuation of left PCA branch vessels suggesting distal  stenosis.   Electronically Signed   By: San Morelle M.D.   On: 11/23/2014 19:54    Microbiology: No results found for this or any previous visit (from the past 240 hour(s)).   Labs: Basic Metabolic Panel:  Recent Labs Lab 11/23/14 1040 11/23/14 1050 11/24/14 0546  NA 138 139 142  K 4.1 4.1 3.9  CL 104 103 105  CO2 24  --  30  GLUCOSE 380* 380* 163*  BUN 18 20 14   CREATININE 1.79* 1.80* 1.34*  CALCIUM 9.1  --  9.3   Liver Function Tests:  Recent Labs Lab 11/23/14 1040  AST 22  ALT 25  ALKPHOS 74  BILITOT 0.9  PROT 6.4*  ALBUMIN 3.7   No results for input(s): LIPASE, AMYLASE in the last 168 hours. No results for input(s): AMMONIA in the last 168 hours. CBC:  Recent Labs Lab 11/23/14 1040 11/23/14 1050 11/24/14 0546  WBC 5.1  --  4.8  NEUTROABS 3.3  --   --   HGB 10.7* 11.6* 10.1*  HCT 30.8* 34.0* 29.2*  MCV 78.0  --  77.2*  PLT 88*  --  100*   Cardiac Enzymes: No results for input(s): CKTOTAL, CKMB, CKMBINDEX, TROPONINI in the last 168  hours. BNP: BNP (last 3 results) No results for input(s): BNP in the last 8760 hours.  ProBNP (last 3 results) No results for input(s): PROBNP in the last 8760 hours.  CBG:  Recent Labs Lab 11/23/14 1639 11/23/14 1950 11/23/14 2209 11/24/14 0631 11/24/14 1127  GLUCAP 399* 196* 94 155* 95       Signed:  [facility] 11/24/2014, 2:15 PM

## 2014-11-24 NOTE — Progress Notes (Signed)
Discharge orders received.  Discharge instructions and follow-up appointments reviewed with the patient's wife.  VSS upon discharge.  IV removed and education complete.  Transported out via wheelchair. Cori Razor, RN

## 2014-11-24 NOTE — Progress Notes (Signed)
Echocardiogram 2D Echocardiogram has been performed.  Jeffery Schneider, Jeffery Schneider 11/24/2014, 10:03 AM

## 2015-09-16 ENCOUNTER — Emergency Department: Payer: Medicare HMO

## 2015-09-16 ENCOUNTER — Emergency Department
Admission: EM | Admit: 2015-09-16 | Discharge: 2015-09-17 | Disposition: A | Discharge status: Home / Self Care | Payer: Medicare HMO | Attending: Emergency Medicine | Admitting: Emergency Medicine

## 2015-09-16 DIAGNOSIS — R4182 Altered mental status, unspecified: Secondary | ICD-10-CM | POA: Diagnosis not present

## 2015-09-16 DIAGNOSIS — I639 Cerebral infarction, unspecified: Secondary | ICD-10-CM | POA: Insufficient documentation

## 2015-09-16 DIAGNOSIS — R079 Chest pain, unspecified: Secondary | ICD-10-CM | POA: Insufficient documentation

## 2015-09-16 DIAGNOSIS — Z880 Allergy status to penicillin: Secondary | ICD-10-CM | POA: Diagnosis not present

## 2015-09-16 DIAGNOSIS — Z79899 Other long term (current) drug therapy: Secondary | ICD-10-CM | POA: Diagnosis not present

## 2015-09-16 DIAGNOSIS — Z7982 Long term (current) use of aspirin: Secondary | ICD-10-CM | POA: Insufficient documentation

## 2015-09-16 DIAGNOSIS — R001 Bradycardia, unspecified: Secondary | ICD-10-CM | POA: Insufficient documentation

## 2015-09-16 DIAGNOSIS — R600 Localized edema: Secondary | ICD-10-CM | POA: Insufficient documentation

## 2015-09-16 DIAGNOSIS — E119 Type 2 diabetes mellitus without complications: Secondary | ICD-10-CM | POA: Diagnosis not present

## 2015-09-16 DIAGNOSIS — N189 Chronic kidney disease, unspecified: Secondary | ICD-10-CM | POA: Diagnosis not present

## 2015-09-16 DIAGNOSIS — I129 Hypertensive chronic kidney disease with stage 1 through stage 4 chronic kidney disease, or unspecified chronic kidney disease: Secondary | ICD-10-CM | POA: Diagnosis not present

## 2015-09-16 DIAGNOSIS — Z794 Long term (current) use of insulin: Secondary | ICD-10-CM | POA: Insufficient documentation

## 2015-09-16 LAB — CBC
HCT: 30.1 % — ABNORMAL LOW (ref 40.0–52.0)
Hemoglobin: 10.2 g/dL — ABNORMAL LOW (ref 13.0–18.0)
MCH: 27 pg (ref 26.0–34.0)
MCHC: 34 g/dL (ref 32.0–36.0)
MCV: 79.4 fL — AB (ref 80.0–100.0)
PLATELETS: 89 10*3/uL — AB (ref 150–440)
RBC: 3.79 MIL/uL — AB (ref 4.40–5.90)
RDW: 14.1 % (ref 11.5–14.5)
WBC: 4.8 10*3/uL (ref 3.8–10.6)

## 2015-09-16 NOTE — ED Notes (Signed)
Pt bib EMS w/ c/o CP that began at 2000.  Per pts wife, pt experienced nausea, SOB and dizziness.  Pt sts pain was L side chest w/ no radiation.  Pt hx of CVA w/ R side weakness.  Pt A/Ox4.  NAD.

## 2015-09-16 NOTE — ED Notes (Signed)
Patient transported to X-ray 

## 2015-09-17 ENCOUNTER — Emergency Department: Payer: Medicare HMO

## 2015-09-17 DIAGNOSIS — R4182 Altered mental status, unspecified: Secondary | ICD-10-CM | POA: Diagnosis not present

## 2015-09-17 LAB — BASIC METABOLIC PANEL
Anion gap: 3 — ABNORMAL LOW (ref 5–15)
BUN: 20 mg/dL (ref 6–20)
CHLORIDE: 109 mmol/L (ref 101–111)
CO2: 30 mmol/L (ref 22–32)
CREATININE: 1.45 mg/dL — AB (ref 0.61–1.24)
Calcium: 8.9 mg/dL (ref 8.9–10.3)
GFR calc Af Amer: 54 mL/min — ABNORMAL LOW (ref >60)
GFR, EST NON AFRICAN AMERICAN: 47 mL/min — AB (ref >60)
GLUCOSE: 230 mg/dL — AB (ref 65–99)
Potassium: 4.1 mmol/L (ref 3.5–5.1)
SODIUM: 142 mmol/L (ref 135–145)

## 2015-09-17 LAB — URINALYSIS COMPLETE WITH MICROSCOPIC (ARMC ONLY)
BILIRUBIN URINE: NEGATIVE
Bacteria, UA: NONE SEEN
Glucose, UA: >500 mg/dL — AB
KETONES UR: NEGATIVE mg/dL
LEUKOCYTES UA: NEGATIVE
NITRITE: NEGATIVE
PH: 7 (ref 5.0–8.0)
PROTEIN: 100 mg/dL — AB
SPECIFIC GRAVITY, URINE: 1.011 (ref 1.005–1.030)

## 2015-09-17 LAB — TROPONIN I

## 2015-09-17 NOTE — ED Provider Notes (Signed)
[facility] Emergency Department Provider Note   ____________________________________________  Time seen:  I have reviewed the triage vital signs and the triage nursing note.  HISTORY  Chief Complaint Chest Pain   Historian Limited from patient as he has underlying neurologic deficits after a history of hemorrhagic stroke History provided by wife, who is his caregiver  HPI Jeffery Schneider is a 70 y.o. male with a history of prior meningioma resection, hemorrhagic stroke, seizures, bradycardia which was recommended for pacemaker at one point, but was told that he was not a candidate, who is here for evaluation of an altered mental status episode that occurred tonight sometime around 8: 30 and lasted about an hour or so. Wife states that they had dinner around 6:30 and then went to watch TV. He seemed a little "off" which was described as sort of leaning to the side and not being as responsive as usual. She thought maybe he was just tired and was hoping to get into bed when she thought that he might be having a mild seizure. Although in the past 2 days had generalized seizures, she states this didn't look like he probably had a seizure, he was staring off and not really cooperating with following commands. However, when the patient's brother called from [place], she handed the phone to the patient and he was able to talk to his brother and call him by name. Throughout this time she still felt like he wasn't his normal self and asked him about headache and he said no. She asked about chest pain and he said yes. He has chronic right sided weakness from the prior stroke. She did not notice any new weakness or numbness.  By the time the patient was here in the emergency department, the wife feels like he is essentially back to his normal baseline mental status.  She reports history of TIA in the past, but he is unable to take any blood thinners due to his prior history of  hemorrhagic stroke.    Past Medical History  Diagnosis Date  . Hypertension   . Diabetes mellitus without complication ([place])   . Brain tumor ([place])   . Stroke ([place])   . Kidney disease   . Hyperlipidemia   . Bradycardia   . Syncope   . Abnormal EKG   . Dementia   . GERD (gastroesophageal reflux disease)   . Dysphagia   . Diabetes ([place])   . Constipation   . Seizures [facility])     Patient Active Problem List   Diagnosis Date Noted  . Weakness 11/23/2014  . CKD (chronic kidney disease) 11/23/2014  . Seizure ([place]) 11/23/2014  . TIA (transient ischemic attack)   . Benign essential HTN   . Chest pain 07/19/2014  . Dysphagia   . Diabetes ([place])   . Constipation     Past Surgical History  Procedure Laterality Date  . Cardiac catheterization    . Craniotomy for tumor      frontal lobe    Current Outpatient Rx  Name  Route  Sig  Dispense  Refill  . Cholecalciferol (VITAMIN D) 2000 UNITS CAPS   Oral   Take 2,000 Units by mouth 2 (two) times a week.         . finasteride (PROSCAR) 5 MG tablet   Oral   Take 5 mg by mouth daily.      11   . insulin glargine (LANTUS) 100 UNIT/ML injection   Subcutaneous   Inject  12 Units into the skin at bedtime.         . levETIRAcetam (KEPPRA) 1000 MG tablet   Oral   Take 1,000 mg by mouth 2 (two) times daily.          Marland Kitchen lubiprostone (AMITIZA) 24 MCG capsule   Oral   Take 24 mcg by mouth 2 (two) times daily with a meal.         . Melatonin 2.5 MG CAPS   Oral   Take 2.5 mg by mouth daily.          . pantoprazole (PROTONIX) 40 MG tablet   Oral   Take 40 mg by mouth daily.         . sertraline (ZOLOFT) 100 MG tablet   Oral   Take 100 mg by mouth daily.         Marland Kitchen terazosin (HYTRIN) 2 MG capsule   Oral   Take 2 mg by mouth at bedtime.         . traZODone (DESYREL) 50 MG tablet   Oral   Take 50 mg by mouth at bedtime as needed for sleep.         Marland Kitchen aspirin 81 MG chewable tablet   Oral   Chew 1 tablet (81  mg total) by mouth daily. Patient not taking: Reported on 09/16/2015         . insulin aspart (NOVOLOG) 100 UNIT/ML injection   Subcutaneous   Inject 6-7 Units into the skin 3 (three) times daily before meals. Sliding scale: 151-200=1 unit, 201-250=2 unit, 251-300=3 units, 301-350=4 units, 351-400=5 units, over 400 call dr         . simvastatin (ZOCOR) 20 MG tablet   Oral   Take 1 tablet (20 mg total) by mouth daily at 6 PM. Patient not taking: Reported on 09/16/2015   30 tablet   1     Allergies Ace inhibitors; Calcitonin; Coffee bean extract; Influenza vaccines; Lisinopril; Loratadine; Penicillins; Sulfa antibiotics; Actos; Codeine; and Streptomycin  Family History  Problem Relation Age of Onset  . Heart attack Mother   . Heart attack Father   . Thyroid cancer Sister   . Diabetes Sister   . Heart attack Brother     Social History Social History  Substance Use Topics  . Smoking status: Never Smoker   . Smokeless tobacco: None  . Alcohol Use: No    Review of Systems  Constitutional: Negative for fever. Eyes: Negative for visual changes. ENT: Negative for sore throat. Cardiovascular: Chest pain earlier, none now.Marland Kitchen Respiratory: Negative for shortness of breath. Gastrointestinal: Negative for abdominal pain, vomiting and diarrhea. Genitourinary: Negative for dysuria. Musculoskeletal: Negative for back pain. Skin: Negative for rash. Neurological: Negative for headache. 10 point Review of Systems otherwise negative ____________________________________________   PHYSICAL EXAM:  VITAL SIGNS: ED Triage Vitals  Enc Vitals Group     BP 09/16/15 2314 193/62 mmHg     Pulse Rate 09/16/15 2314 44     Resp 09/16/15 2314 13     Temp 09/16/15 2314 98.1 F (36.7 C)     Temp Source 09/16/15 2314 Oral     SpO2 09/16/15 2314 97 %     Weight 09/16/15 2314 185 lb (83.915 kg)     Height --      Head Cir --      Peak Flow --      Pain Score 09/16/15 2315 1     Pain Loc  --  Pain Edu? --      Excl. in [place]? --      Constitutional: Alert and cooperative, but poor historian. Well appearing and in no distress. HEENT   Head: Craniotomy scar.      Eyes: Conjunctivae are normal. PERRL. Normal extraocular movements.      Ears:         Nose: No congestion/rhinnorhea.   Mouth/Throat: Mucous membranes are moist.   Neck: No stridor. Cardiovascular/Chest: Bradycardic, regular..  No murmurs, rubs, or gallops. Respiratory: Normal respiratory effort without tachypnea nor retractions. Breath sounds are clear and equal bilaterally. No wheezes/rales/rhonchi. Gastrointestinal: Soft. No distention, no guarding, no rebound. Nontender.    Genitourinary/rectal:Deferred Musculoskeletal: Nontender with normal range of motion in all extremities. No joint effusions.  No lower extremity tenderness.  Trace edema bilateral lower extremities. Neurologic:  Minimal verbal, but no slurred speech noted. No facial droop. Some weakness on the right side, old per wife Skin:  Skin is warm, dry and intact. No rash noted.   ____________________________________________   EKG I, Lisa Roca, MD, the attending physician have personally viewed and interpreted all ECGs.  46 bpm. Sinus bradycardia. Narrow QRS. Left axis deviation. T-wave inversions laterally and inferiorly ____________________________________________  LABS (pertinent positives/negatives)  Basic metabolic panel significant for creatinine 1.45 and otherwise without significant abnormality White blood count 4.8, hemoglobin 10.2 and platelet count 89 Troponin less than 0.03 Urinalysis too numerous to count red blood cells ____________________________________________  RADIOLOGY All Xrays were viewed by me. Imaging interpreted by Radiologist.  Chest x-ray two-view:  IMPRESSION: No evidence of acute cardiopulmonary disease.  8 mm nodular opacity overlying the right upper lobe, possibly reflecting a calcified  granuloma. Correlate with prior imaging (if available) to document stability. Otherwise, consider follow-up CT chest without contrast.  Head CT noncontrast:  IMPRESSION: No acute intracranial hemorrhage.  Age-related atrophy chronic microvascular ischemic changes.  Postsurgical changes of left frontal craniotomy.  Ct Chest noncon:  IMPRESSION: A calcified granuloma accounts for the nodule identified on chest radiograph. Additional noncalcified nodules are demonstrated in the right lung, measuring up to about 8 mm diameter. See above followup recommendations. No evidence of active pulmonary disease. __________________________________________  PROCEDURES  Procedure(s) performed: None  Critical Care performed: None  ____________________________________________   ED COURSE / ASSESSMENT AND PLAN  Pertinent labs & imaging results that were available during my care of the patient were reviewed by me and considered in my medical decision making (see chart for details).   Upon my evaluation, patient is a poor historian, and wife reports that he is essentially at his neurologic baseline now.  In discussion it sounds like he was a little altered from his baseline for about 2 hours this evening, and wife is not exactly sure why. She thinks he may have had a seizure. The description is not completely clear to me that it was a seizure, but I also don't have really an alternate diagnosis. It's possible that this could've been TIA, although there is no specific neurologic complaint. If indeed it was a TIA, patient cannot be on blood thinners either way due to history of hemorrhagic stroke.  I discussed with his wife/caregiver planned to evaluate with head CT and labs and urine.  Chest x-ray showed possible granuloma and recommended CT noncontrast of the chest, and this was performed and was consistent with likely granuloma. Patient was informed to follow-up with his private care by her for  follow-up to this likely in 6-12 months.  I'm not highly  suspicious for ACS, and his exam and evaluation are reassuring.  The main complaint was this altered mental status episode. Again, I am most suspicious that this may have been related to seizure. Wife states that he had previously been on 3 g of Keppra daily, but brought back down to 2 g. I'm going to make any changes, but I am and asked them to follow up with her primary prescribing provider to discuss this possible seizure and possible change in management medications doses.  I discussed the possibility of observation for this altered mental status episode, specifically with regard to the possibility of TIA. However with a like to go ahead and take him home. There is nothing specific that makes me feel like this was a stroke/TIA, and his neuro exam is back to baseline. She could not be treated with blood thinners due to prior hemorrhagic stroke. Wife is comfortable taking him home now.   CONSULTATIONS:   None   Patient / Family / Caregiver informed of clinical course, medical decision-making process, and agree with plan.   I discussed return precautions, follow-up instructions, and discharged instructions with patient and/or family.   ___________________________________________   FINAL CLINICAL IMPRESSION(S) / ED DIAGNOSES   Final diagnoses:  Altered mental status, unspecified altered mental status type              Note: This dictation was prepared with Dragon dictation. Any transcriptional errors that result from this process are unintentional   Lisa Roca, MD 09/17/15 [phone]

## 2015-09-17 NOTE — ED Notes (Signed)
Patient transported to CT 

## 2015-09-17 NOTE — ED Notes (Signed)
Pt returned from CT °

## 2015-09-17 NOTE — Discharge Instructions (Signed)
You were evaluated for episode of altered mental status, any chest discomfort, and although no certain cause was found, your exam and evaluation are reassuring tonight in the emergency room.  We discussed observation in the hospital versus going home, and chose to let you gone home tonight.  I am suspicious that the episode may have been related to a seizure, and I wanted to discuss that with your prescribing physician with regard to any changes in Her medication dosing.  Return to the emergency department for any worsening condition including any altered mental status, confusion, weakness or numbness, fever, new or worsening chest pain, or any other symptoms concerning to.  \Epilepsy Epilepsy is a disorder in which a person has repeated seizures over time. A seizure is a release of abnormal electrical activity in the brain. Seizures can cause a change in attention, behavior, or the ability to remain awake and alert (altered mental status). Seizures often involve uncontrollable shaking (convulsions).  Most people with epilepsy lead normal lives. However, people with epilepsy are at an increased risk of falls, accidents, and injuries. Therefore, it is important to begin treatment right away. CAUSES  Epilepsy has many possible causes. Anything that disturbs the normal pattern of brain cell activity can lead to seizures. This may include:   Head injury.  Birth trauma.  High fever as a child.  Stroke.  Bleeding into or around the brain.  Certain drugs.  Prolonged low oxygen, such as what occurs after CPR efforts.  Abnormal brain development.  Certain illnesses, such as meningitis, encephalitis (brain infection), malaria, and other infections.  An imbalance of nerve signaling chemicals (neurotransmitters).  SIGNS AND SYMPTOMS  The symptoms of a seizure can vary greatly from one person to another. Right before a seizure, you may have a warning (aura) that a seizure is about to occur. An  aura may include the following symptoms:  Fear or anxiety.  Nausea.  Feeling like the room is spinning (vertigo).  Vision changes, such as seeing flashing lights or spots. Common symptoms during a seizure include:  Abnormal sensations, such as an abnormal smell or a bitter taste in the mouth.   Sudden, general body stiffness.   Convulsions that involve rhythmic jerking of the face, arm, or leg on one or both sides.   Sudden change in consciousness.   Appearing to be awake but not responding.   Appearing to be asleep but cannot be awakened.   Grimacing, chewing, lip smacking, drooling, tongue biting, or loss of bowel or bladder control. After a seizure, you may feel sleepy for a while. DIAGNOSIS  Your health care provider will ask about your symptoms and take a medical history. Descriptions from any witnesses to your seizures will be very helpful in the diagnosis. A physical exam, including a detailed neurological exam, is necessary. Various tests may be done, such as:   An electroencephalogram (EEG). This is a painless test of your brain waves. In this test, a diagram is created of your brain waves. These diagrams can be interpreted by a specialist.  An MRI of the brain.   A CT scan of the brain.   A spinal tap (lumbar puncture, LP).  Blood tests to check for signs of infection or abnormal blood chemistry. TREATMENT  There is no cure for epilepsy, but it is generally treatable. Once epilepsy is diagnosed, it is important to begin treatment as soon as possible. For most people with epilepsy, seizures can be controlled with medicines. The following may also  be used:  A pacemaker for the brain (vagus nerve stimulator) can be used for people with seizures that are not well controlled by medicine.  Surgery on the brain. For some people, epilepsy eventually goes away. HOME CARE INSTRUCTIONS   Follow your health care provider's recommendations on driving and safety in  normal activities.  Get enough rest. Lack of sleep can cause seizures.  Only take over-the-counter or prescription medicines as directed by your health care provider. Take any prescribed medicine exactly as directed.  Avoid any known triggers of your seizures.  Keep a seizure diary. Record what you recall about any seizure, especially any possible trigger.   Make sure the people you live and work with know that you are prone to seizures. They should receive instructions on how to help you. In general, a witness to a seizure should:   Cushion your head and body.   Turn you on your side.   Avoid unnecessarily restraining you.   Not place anything inside your mouth.   Call for emergency medical help if there is any question about what has occurred.   Follow up with your health care provider as directed. You may need regular blood tests to monitor the levels of your medicine.  SEEK MEDICAL CARE IF:   You develop signs of infection or other illness. This might increase the risk of a seizure.   You seem to be having more frequent seizures.   Your seizure pattern is changing.  SEEK IMMEDIATE MEDICAL CARE IF:   You have a seizure that does not stop after a few moments.   You have a seizure that causes any difficulty in breathing.   You have a seizure that results in a very severe headache.   You have a seizure that leaves you with the inability to speak or use a part of your body.    This information is not intended to replace advice given to you by your health care provider. Make sure you discuss any questions you have with your health care provider.   Document Released: 06/10/2005 Document Revised: 03/31/2013 Document Reviewed: 01/20/2013 Elsevier Interactive Patient Education [employer].

## 2015-10-30 ENCOUNTER — Emergency Department
Admission: EM | Admit: 2015-10-30 | Discharge: 2015-10-30 | Disposition: A | Discharge status: Home / Self Care | Payer: Medicare HMO | Attending: Emergency Medicine | Admitting: Emergency Medicine

## 2015-10-30 ENCOUNTER — Encounter: Payer: Self-pay | Admitting: Emergency Medicine

## 2015-10-30 DIAGNOSIS — I129 Hypertensive chronic kidney disease with stage 1 through stage 4 chronic kidney disease, or unspecified chronic kidney disease: Secondary | ICD-10-CM | POA: Insufficient documentation

## 2015-10-30 DIAGNOSIS — N189 Chronic kidney disease, unspecified: Secondary | ICD-10-CM | POA: Diagnosis not present

## 2015-10-30 DIAGNOSIS — E785 Hyperlipidemia, unspecified: Secondary | ICD-10-CM | POA: Insufficient documentation

## 2015-10-30 DIAGNOSIS — E1122 Type 2 diabetes mellitus with diabetic chronic kidney disease: Secondary | ICD-10-CM | POA: Insufficient documentation

## 2015-10-30 DIAGNOSIS — Z794 Long term (current) use of insulin: Secondary | ICD-10-CM | POA: Insufficient documentation

## 2015-10-30 DIAGNOSIS — Z0000 Encounter for general adult medical examination without abnormal findings: Secondary | ICD-10-CM | POA: Insufficient documentation

## 2015-10-30 DIAGNOSIS — R300 Dysuria: Secondary | ICD-10-CM | POA: Diagnosis present

## 2015-10-30 DIAGNOSIS — Z8673 Personal history of transient ischemic attack (TIA), and cerebral infarction without residual deficits: Secondary | ICD-10-CM | POA: Insufficient documentation

## 2015-10-30 HISTORY — DX: Benign prostatic hyperplasia without lower urinary tract symptoms: N40.0

## 2015-10-30 HISTORY — DX: Sickle-cell trait: D57.3

## 2015-10-30 HISTORY — DX: Chronic kidney disease, unspecified: N18.9

## 2015-10-30 LAB — COMPREHENSIVE METABOLIC PANEL
ALT: 15 U/L — AB (ref 17–63)
AST: 15 U/L (ref 15–41)
Albumin: 3.9 g/dL (ref 3.5–5.0)
Alkaline Phosphatase: 75 U/L (ref 38–126)
Anion gap: 7 (ref 5–15)
BUN: 20 mg/dL (ref 6–20)
CHLORIDE: 107 mmol/L (ref 101–111)
CO2: 30 mmol/L (ref 22–32)
CREATININE: 1.51 mg/dL — AB (ref 0.61–1.24)
Calcium: 9.4 mg/dL (ref 8.9–10.3)
GFR calc Af Amer: 52 mL/min — ABNORMAL LOW (ref >60)
GFR, EST NON AFRICAN AMERICAN: 45 mL/min — AB (ref >60)
Glucose, Bld: 218 mg/dL — ABNORMAL HIGH (ref 65–99)
Potassium: 4.4 mmol/L (ref 3.5–5.1)
Sodium: 144 mmol/L (ref 135–145)
Total Bilirubin: 0.3 mg/dL (ref 0.3–1.2)
Total Protein: 6.7 g/dL (ref 6.5–8.1)

## 2015-10-30 LAB — CBC WITH DIFFERENTIAL/PLATELET
BASOS ABS: 0 10*3/uL (ref 0–0.1)
Basophils Relative: 0 %
Eosinophils Absolute: 0.2 10*3/uL (ref 0–0.7)
HCT: 33.3 % — ABNORMAL LOW (ref 40.0–52.0)
Hemoglobin: 11.1 g/dL — ABNORMAL LOW (ref 13.0–18.0)
Lymphs Abs: 0.9 10*3/uL — ABNORMAL LOW (ref 1.0–3.6)
MCH: 26.8 pg (ref 26.0–34.0)
MCHC: 33.3 g/dL (ref 32.0–36.0)
MCV: 80.4 fL (ref 80.0–100.0)
Monocytes Absolute: 0.3 10*3/uL (ref 0.2–1.0)
Monocytes Relative: 8 %
NEUTROS ABS: 2.9 10*3/uL (ref 1.4–6.5)
Neutrophils Relative %: 67 %
PLATELETS: 92 10*3/uL — AB (ref 150–440)
RBC: 4.13 MIL/uL — AB (ref 4.40–5.90)
RDW: 14.4 % (ref 11.5–14.5)
WBC: 4.4 10*3/uL (ref 3.8–10.6)

## 2015-10-30 LAB — URINALYSIS COMPLETE WITH MICROSCOPIC (ARMC ONLY)
Bacteria, UA: NONE SEEN
Bilirubin Urine: NEGATIVE
Glucose, UA: >1000 mg/dL — AB
Ketones, ur: NEGATIVE mg/dL
Leukocytes, UA: NEGATIVE
Nitrite: NEGATIVE
PROTEIN: 100 mg/dL — AB
SPECIFIC GRAVITY, URINE: 1.015 (ref 1.005–1.030)
SQUAMOUS EPITHELIAL / LPF: NONE SEEN
pH: 5 (ref 5.0–8.0)

## 2015-10-30 NOTE — ED Notes (Signed)
Increaased urinary frequency and odor x 3 days. Saw MD today and was called to be seen in ER due to elevated creatinine.

## 2015-10-30 NOTE — Discharge Instructions (Signed)
You have been seen in the emergency department for possible dehydration. Your workup including labs and urinalysis are largely at baseline values. Your urine does show an increased amount of glucose, which could possibly be improved by monitoring daily glucose values and attempting to keep blood glucose below 200. Please follow-up with her primary care physician as scheduled. Return to the emergency department for any acutely concerning issues.   Diabetes and Standards of Medical Care Diabetes is complicated. You may find that your diabetes team includes a dietitian, nurse, diabetes educator, eye doctor, and more. To help everyone know what is going on and to help you get the care you deserve, the following schedule of care was developed to help keep you on track. Below are the tests, exams, vaccines, medicines, education, and plans you will need. HbA1c test This test shows how well you have controlled your glucose over the past 2-3 months. It is used to see if your diabetes management plan needs to be adjusted.   It is performed at least 2 times a year if you are meeting treatment goals.  It is performed 4 times a year if therapy has changed or if you are not meeting treatment goals. Blood pressure test  This test is performed at every routine medical visit. The goal is less than 140/90 mm Hg for most people, but 130/80 mm Hg in some cases. Ask your health care provider about your goal. Dental exam  Follow up with the dentist regularly. Eye exam  If you are diagnosed with type 1 diabetes as a child, get an exam upon reaching the age of 46 years or older and having had diabetes for 3-5 years. Yearly eye exams are recommended after that initial eye exam.  If you are diagnosed with type 1 diabetes as an adult, get an exam within 5 years of diagnosis and then yearly.  If you are diagnosed with type 2 diabetes, get an exam as soon as possible after the diagnosis and then yearly. Foot care  exam  Visual foot exams are performed at every routine medical visit. The exams check for cuts, injuries, or other problems with the feet.  You should have a complete foot exam performed every year. This exam includes an inspection of the structure and skin of your feet, a check of the pulses in your feet, and a check of the sensation in your feet.  Type 1 diabetes: The first exam is performed 5 years after diagnosis.  Type 2 diabetes: The first exam is performed at the time of diagnosis.  Check your feet nightly for cuts, injuries, or other problems with your feet. Tell your health care provider if anything is not healing. Kidney function test (urine microalbumin)  This test is performed once a year.  Type 1 diabetes: The first test is performed 5 years after diagnosis.  Type 2 diabetes: The first test is performed at the time of diagnosis.  A serum creatinine and estimated glomerular filtration rate (eGFR) test is done once a year to assess the level of chronic kidney disease (CKD), if present. Lipid profile (cholesterol, HDL, LDL, triglycerides)  Performed every 5 years for most people.  The goal for LDL is less than 100 mg/dL. If you are at high risk, the goal is less than 70 mg/dL.  The goal for HDL is 40 mg/dL-50 mg/dL for men and 50 mg/dL-60 mg/dL for women. An HDL cholesterol of 60 mg/dL or higher gives some protection against heart disease.  The goal  for triglycerides is less than 150 mg/dL. Immunizations  The flu (influenza) vaccine is recommended yearly for every person 34 months of age or older who has diabetes.  The pneumonia (pneumococcal) vaccine is recommended for every person 7 years of age or older who has diabetes. Adults 70 years of age or older may receive the pneumonia vaccine as a series of two separate shots.  The hepatitis B vaccine is recommended for adults shortly after they have been diagnosed with diabetes.  The Tdap (tetanus, diphtheria, and  pertussis) vaccine should be given:  According to normal childhood vaccination schedules, for children.  Every 10 years, for adults who have diabetes. Diabetes self-management education  Education is recommended at diagnosis and ongoing as needed. Treatment plan  Your treatment plan is reviewed at every medical visit.   This information is not intended to replace advice given to you by your health care provider. Make sure you discuss any questions you have with your health care provider.   Document Released: 04/07/2009 Document Revised: 07/01/2014 Document Reviewed: 11/10/2012 Elsevier Interactive Patient Education [employer].

## 2015-10-30 NOTE — ED Provider Notes (Signed)
[facility] Emergency Department Provider Note  Time seen: 3:17 PM  I have reviewed the triage vital signs and the nursing notes.   HISTORY  Chief Complaint Dysuria    HPI Jeffery Schneider is a 70 y.o. male with a past medical history of hypertension, diabetes, brain tumor status post resection, CVA, CK D, hyperlipidemia, gastric reflux, seizure disorder, presents to the emergency department for dehydration. According to the wife who is the patient's caregiver she has been noticing excessive urination over the last week or 2. States the patient has been drinking more to try to keep up but she was concerned that he was losing too much urine. She had a visit with their endocrinologist today at the [place]. They did blood work and while driving back home today she received a phone call telling her that the patient looked dehydrated need to go to the emergency department for fluids. Wife states she has noted a fishy odor lately in the urine but also coming from his mouth. Patient denies any complaints, but due to his CVA history is not able to provide an adequate history per wife.     Past Medical History  Diagnosis Date  . Hypertension   . Diabetes mellitus without complication ([place])   . Brain tumor ([place])   . Stroke ([place])   . Kidney disease   . Hyperlipidemia   . Bradycardia   . Syncope   . Abnormal EKG   . Dementia   . GERD (gastroesophageal reflux disease)   . Dysphagia   . Diabetes ([place])   . Constipation   . Seizures ([place])   . Sickle cell trait ([place])   . Benign prostatic hyperplasia   . Chronic kidney disease     Patient Active Problem List   Diagnosis Date Noted  . Weakness 11/23/2014  . CKD (chronic kidney disease) 11/23/2014  . Seizure ([place]) 11/23/2014  . TIA (transient ischemic attack)   . Benign essential HTN   . Chest pain 07/19/2014  . Dysphagia   . Diabetes ([place])   . Constipation     Past Surgical History  Procedure Laterality Date  .  Cardiac catheterization    . Craniotomy for tumor      frontal lobe    Current Outpatient Rx  Name  Route  Sig  Dispense  Refill  . aspirin 81 MG chewable tablet   Oral   Chew 1 tablet (81 mg total) by mouth daily. Patient not taking: Reported on 09/16/2015         . Cholecalciferol (VITAMIN D) 2000 UNITS CAPS   Oral   Take 2,000 Units by mouth 2 (two) times a week.         . finasteride (PROSCAR) 5 MG tablet   Oral   Take 5 mg by mouth daily.      11   . insulin aspart (NOVOLOG) 100 UNIT/ML injection   Subcutaneous   Inject 6-7 Units into the skin 3 (three) times daily before meals. Sliding scale: 151-200=1 unit, 201-250=2 unit, 251-300=3 units, 301-350=4 units, 351-400=5 units, over 400 call dr         . insulin glargine (LANTUS) 100 UNIT/ML injection   Subcutaneous   Inject 12 Units into the skin at bedtime.         . levETIRAcetam (KEPPRA) 1000 MG tablet   Oral   Take 1,000 mg by mouth 2 (two) times daily.          Marland Kitchen lubiprostone (AMITIZA)  24 MCG capsule   Oral   Take 24 mcg by mouth 2 (two) times daily with a meal.         . Melatonin 2.5 MG CAPS   Oral   Take 2.5 mg by mouth daily.          . pantoprazole (PROTONIX) 40 MG tablet   Oral   Take 40 mg by mouth daily.         . sertraline (ZOLOFT) 100 MG tablet   Oral   Take 100 mg by mouth daily.         . simvastatin (ZOCOR) 20 MG tablet   Oral   Take 1 tablet (20 mg total) by mouth daily at 6 PM. Patient not taking: Reported on 09/16/2015   30 tablet   1   . terazosin (HYTRIN) 2 MG capsule   Oral   Take 2 mg by mouth at bedtime.         . traZODone (DESYREL) 50 MG tablet   Oral   Take 50 mg by mouth at bedtime as needed for sleep.           Allergies Ace inhibitors; Calcitonin; Coffee bean extract; Influenza vaccines; Lisinopril; Loratadine; Penicillins; Sulfa antibiotics; Actos; Codeine; and Streptomycin  Family History  Problem Relation Age of Onset  . Heart attack  Mother   . Heart attack Father   . Thyroid cancer Sister   . Diabetes Sister   . Heart attack Brother     Social History Social History  Substance Use Topics  . Smoking status: Never Smoker   . Smokeless tobacco: None  . Alcohol Use: No    Review of Systems Unable to obtain an adequate review of systems due to memory impairment/CVA. ____________________________________________   PHYSICAL EXAM:  VITAL SIGNS: ED Triage Vitals  Enc Vitals Group     BP 10/30/15 1327 159/56 mmHg     Pulse Rate 10/30/15 1327 52     Resp 10/30/15 1327 20     Temp 10/30/15 1327 97.7 F (36.5 C)     Temp src --      SpO2 10/30/15 1327 100 %     Weight 10/30/15 1327 188 lb (85.276 kg)     Height 10/30/15 1327 5\' 10"  (1.778 m)     Head Cir --      Peak Flow --      Pain Score --      Pain Loc --      Pain Edu? --      Excl. in [place]? --     Constitutional: Alert. Able to answer simple yes no questions however the wife states not accurately. Eyes: Normal exam ENT   Head: Normocephalic and atraumatic.   Mouth/Throat: Mucous membranes are moist. Cardiovascular: Normal rate, regular rhythm. No murmur Respiratory: Normal respiratory effort without tachypnea nor retractions. Breath sounds are clear  Gastrointestinal: Soft and nontender. No reaction to abdominal palpation. No distention. Musculoskeletal: Nontender with normal range of motion in all extremities.  Neurologic:  Normal speech and language. No gross focal neurologic deficits Skin:  Skin is warm, dry and intact.  Psychiatric: Mood and affect are normal.   ____________________________________________    INITIAL IMPRESSION / ASSESSMENT AND PLAN / ED COURSE  Pertinent labs & imaging results that were available during my care of the patient were reviewed by me and considered in my medical decision making (see chart for details).  Patient presents the emergency department for possible dehydration. The  patient's lab work in the  emergency department his kidney function appears baseline. No anion gap. Labs are within normal limits. Urinalysis pending. Patient has a nontender abdominal exam, no apparent discomfort.  Patient's labs have resulted largely at the patient's baseline. No urinary tract infection on urinalysis. Labs including kidney function are at baseline. I was able to discuss the patient with the nurse at the patient's endocrinology office. They were concerned because the patient's sodium had increased from 140-148. Our lab values 144 4 sodium. Creatinine appears at baseline. I discussed with the patient's wife to increase the patient's oral fluids although at this time I did not feel the patient requires IV hydration, and he will follow-up with his primary care doctor as scheduled.  ____________________________________________   FINAL CLINICAL IMPRESSION(S) / ED DIAGNOSES  Normal examination   Harvest Dark, MD 10/30/15 1546

## 2015-10-30 NOTE — ED Notes (Signed)
Pt has had increased frequency and foul odor to urine x 3 days. Pt saw MD today, who stated that his creatinine was abnormal. Pt alert and has dementia, memory issues due to prior stroke. NAD noted.

## 2015-10-30 NOTE — ED Notes (Signed)
Pt discharged home after verbalizing understanding of discharge instructions; nad noted. 

## 2015-11-02 ENCOUNTER — Encounter: Payer: Self-pay | Admitting: Emergency Medicine

## 2015-11-02 ENCOUNTER — Observation Stay
Admission: EM | Admit: 2015-11-02 | Discharge: 2015-11-07 | Payer: Medicare HMO | Attending: Internal Medicine | Admitting: Internal Medicine

## 2015-11-02 ENCOUNTER — Emergency Department: Payer: Medicare HMO

## 2015-11-02 DIAGNOSIS — N183 Chronic kidney disease, stage 3 (moderate): Secondary | ICD-10-CM | POA: Insufficient documentation

## 2015-11-02 DIAGNOSIS — Z86011 Personal history of benign neoplasm of the brain: Secondary | ICD-10-CM | POA: Insufficient documentation

## 2015-11-02 DIAGNOSIS — Z8249 Family history of ischemic heart disease and other diseases of the circulatory system: Secondary | ICD-10-CM | POA: Insufficient documentation

## 2015-11-02 DIAGNOSIS — N401 Enlarged prostate with lower urinary tract symptoms: Secondary | ICD-10-CM | POA: Diagnosis not present

## 2015-11-02 DIAGNOSIS — Z9889 Other specified postprocedural states: Secondary | ICD-10-CM | POA: Insufficient documentation

## 2015-11-02 DIAGNOSIS — R809 Proteinuria, unspecified: Secondary | ICD-10-CM | POA: Insufficient documentation

## 2015-11-02 DIAGNOSIS — I129 Hypertensive chronic kidney disease with stage 1 through stage 4 chronic kidney disease, or unspecified chronic kidney disease: Secondary | ICD-10-CM | POA: Diagnosis not present

## 2015-11-02 DIAGNOSIS — N179 Acute kidney failure, unspecified: Secondary | ICD-10-CM

## 2015-11-02 DIAGNOSIS — D573 Sickle-cell trait: Secondary | ICD-10-CM | POA: Insufficient documentation

## 2015-11-02 DIAGNOSIS — I214 Non-ST elevation (NSTEMI) myocardial infarction: Principal | ICD-10-CM | POA: Insufficient documentation

## 2015-11-02 DIAGNOSIS — E870 Hyperosmolality and hypernatremia: Secondary | ICD-10-CM | POA: Insufficient documentation

## 2015-11-02 DIAGNOSIS — K5900 Constipation, unspecified: Secondary | ICD-10-CM | POA: Insufficient documentation

## 2015-11-02 DIAGNOSIS — K219 Gastro-esophageal reflux disease without esophagitis: Secondary | ICD-10-CM | POA: Insufficient documentation

## 2015-11-02 DIAGNOSIS — I700 Atherosclerosis of aorta: Secondary | ICD-10-CM | POA: Insufficient documentation

## 2015-11-02 DIAGNOSIS — Z8673 Personal history of transient ischemic attack (TIA), and cerebral infarction without residual deficits: Secondary | ICD-10-CM | POA: Insufficient documentation

## 2015-11-02 DIAGNOSIS — Z794 Long term (current) use of insulin: Secondary | ICD-10-CM | POA: Diagnosis not present

## 2015-11-02 DIAGNOSIS — E1122 Type 2 diabetes mellitus with diabetic chronic kidney disease: Secondary | ICD-10-CM | POA: Diagnosis not present

## 2015-11-02 DIAGNOSIS — Z833 Family history of diabetes mellitus: Secondary | ICD-10-CM | POA: Insufficient documentation

## 2015-11-02 DIAGNOSIS — Z91018 Allergy to other foods: Secondary | ICD-10-CM | POA: Insufficient documentation

## 2015-11-02 DIAGNOSIS — E876 Hypokalemia: Secondary | ICD-10-CM | POA: Insufficient documentation

## 2015-11-02 DIAGNOSIS — Z888 Allergy status to other drugs, medicaments and biological substances status: Secondary | ICD-10-CM | POA: Insufficient documentation

## 2015-11-02 DIAGNOSIS — I959 Hypotension, unspecified: Secondary | ICD-10-CM | POA: Diagnosis not present

## 2015-11-02 DIAGNOSIS — E1165 Type 2 diabetes mellitus with hyperglycemia: Secondary | ICD-10-CM | POA: Insufficient documentation

## 2015-11-02 DIAGNOSIS — Z885 Allergy status to narcotic agent status: Secondary | ICD-10-CM | POA: Insufficient documentation

## 2015-11-02 DIAGNOSIS — E11319 Type 2 diabetes mellitus with unspecified diabetic retinopathy without macular edema: Secondary | ICD-10-CM | POA: Insufficient documentation

## 2015-11-02 DIAGNOSIS — N3289 Other specified disorders of bladder: Secondary | ICD-10-CM | POA: Diagnosis not present

## 2015-11-02 DIAGNOSIS — F0390 Unspecified dementia without behavioral disturbance: Secondary | ICD-10-CM | POA: Diagnosis not present

## 2015-11-02 DIAGNOSIS — E860 Dehydration: Secondary | ICD-10-CM | POA: Insufficient documentation

## 2015-11-02 DIAGNOSIS — N170 Acute kidney failure with tubular necrosis: Secondary | ICD-10-CM | POA: Diagnosis not present

## 2015-11-02 DIAGNOSIS — R350 Frequency of micturition: Secondary | ICD-10-CM | POA: Insufficient documentation

## 2015-11-02 DIAGNOSIS — Z882 Allergy status to sulfonamides status: Secondary | ICD-10-CM | POA: Insufficient documentation

## 2015-11-02 DIAGNOSIS — R55 Syncope and collapse: Secondary | ICD-10-CM | POA: Diagnosis present

## 2015-11-02 DIAGNOSIS — R1310 Dysphagia, unspecified: Secondary | ICD-10-CM | POA: Insufficient documentation

## 2015-11-02 DIAGNOSIS — Z887 Allergy status to serum and vaccine status: Secondary | ICD-10-CM | POA: Insufficient documentation

## 2015-11-02 DIAGNOSIS — G9389 Other specified disorders of brain: Secondary | ICD-10-CM | POA: Insufficient documentation

## 2015-11-02 DIAGNOSIS — Z808 Family history of malignant neoplasm of other organs or systems: Secondary | ICD-10-CM | POA: Insufficient documentation

## 2015-11-02 DIAGNOSIS — G40909 Epilepsy, unspecified, not intractable, without status epilepticus: Secondary | ICD-10-CM | POA: Insufficient documentation

## 2015-11-02 DIAGNOSIS — E785 Hyperlipidemia, unspecified: Secondary | ICD-10-CM | POA: Insufficient documentation

## 2015-11-02 DIAGNOSIS — I440 Atrioventricular block, first degree: Secondary | ICD-10-CM | POA: Diagnosis not present

## 2015-11-02 DIAGNOSIS — N189 Chronic kidney disease, unspecified: Secondary | ICD-10-CM

## 2015-11-02 DIAGNOSIS — G8384 Todd's paralysis (postepileptic): Secondary | ICD-10-CM | POA: Insufficient documentation

## 2015-11-02 DIAGNOSIS — R001 Bradycardia, unspecified: Secondary | ICD-10-CM | POA: Insufficient documentation

## 2015-11-02 DIAGNOSIS — Z79899 Other long term (current) drug therapy: Secondary | ICD-10-CM | POA: Insufficient documentation

## 2015-11-02 DIAGNOSIS — Z880 Allergy status to penicillin: Secondary | ICD-10-CM | POA: Insufficient documentation

## 2015-11-02 LAB — CBC WITH DIFFERENTIAL/PLATELET
BASOS ABS: 0 10*3/uL (ref 0–0.1)
Basophils Relative: 0 %
Eosinophils Absolute: 0.2 10*3/uL (ref 0–0.7)
Eosinophils Relative: 5 %
HEMATOCRIT: 32.6 % — AB (ref 40.0–52.0)
HEMOGLOBIN: 10.9 g/dL — AB (ref 13.0–18.0)
Lymphs Abs: 0.9 10*3/uL — ABNORMAL LOW (ref 1.0–3.6)
MCH: 26.9 pg (ref 26.0–34.0)
MCHC: 33.4 g/dL (ref 32.0–36.0)
MCV: 80.5 fL (ref 80.0–100.0)
MONO ABS: 0.3 10*3/uL (ref 0.2–1.0)
Monocytes Relative: 8 %
NEUTROS ABS: 2.5 10*3/uL (ref 1.4–6.5)
Platelets: 87 10*3/uL — ABNORMAL LOW (ref 150–440)
RBC: 4.05 MIL/uL — AB (ref 4.40–5.90)
RDW: 14.2 % (ref 11.5–14.5)
WBC: 4 10*3/uL (ref 3.8–10.6)

## 2015-11-02 LAB — COMPREHENSIVE METABOLIC PANEL
ALK PHOS: 73 U/L (ref 38–126)
ALT: 13 U/L — AB (ref 17–63)
AST: 16 U/L (ref 15–41)
Albumin: 3.7 g/dL (ref 3.5–5.0)
Anion gap: 5 (ref 5–15)
BILIRUBIN TOTAL: 0.5 mg/dL (ref 0.3–1.2)
BUN: 22 mg/dL — ABNORMAL HIGH (ref 6–20)
CALCIUM: 9.5 mg/dL (ref 8.9–10.3)
CO2: 31 mmol/L (ref 22–32)
Chloride: 108 mmol/L (ref 101–111)
Creatinine, Ser: 1.4 mg/dL — ABNORMAL HIGH (ref 0.61–1.24)
GFR calc Af Amer: 57 mL/min — ABNORMAL LOW (ref >60)
GFR, EST NON AFRICAN AMERICAN: 49 mL/min — AB (ref >60)
GLUCOSE: 226 mg/dL — AB (ref 65–99)
Potassium: 3.8 mmol/L (ref 3.5–5.1)
Sodium: 144 mmol/L (ref 135–145)
TOTAL PROTEIN: 6.8 g/dL (ref 6.5–8.1)

## 2015-11-02 LAB — GLUCOSE, CAPILLARY
GLUCOSE-CAPILLARY: 198 mg/dL — AB (ref 65–99)
GLUCOSE-CAPILLARY: 449 mg/dL — AB (ref 65–99)

## 2015-11-02 LAB — TROPONIN I

## 2015-11-02 LAB — PROTIME-INR
INR: 1.07
PROTHROMBIN TIME: 14.1 s (ref 11.4–15.0)

## 2015-11-02 LAB — APTT: APTT: 25 s (ref 24–36)

## 2015-11-02 MED ORDER — ONDANSETRON HCL 4 MG/2ML IJ SOLN
4.0000 mg | Freq: Four times a day (QID) | INTRAMUSCULAR | Status: DC | PRN
  Filled 2015-11-02: qty 2

## 2015-11-02 MED ORDER — MELATONIN 5 MG PO TABS
2.5000 mg | ORAL_TABLET | Freq: Every day | ORAL | Status: DC
  Filled 2015-11-02 (×5): qty 0.5

## 2015-11-02 MED ORDER — PANTOPRAZOLE SODIUM 40 MG PO TBEC
40.0000 mg | DELAYED_RELEASE_TABLET | Freq: Every day | ORAL | Status: DC
  Administered 2015-11-03 – 2015-11-07 (×5): 40 mg via ORAL
  Filled 2015-11-02 (×5): qty 1

## 2015-11-02 MED ORDER — SODIUM CHLORIDE 0.9% FLUSH
3.0000 mL | Freq: Two times a day (BID) | INTRAVENOUS | Status: DC
  Administered 2015-11-03 – 2015-11-05 (×3): 3 mL via INTRAVENOUS

## 2015-11-02 MED ORDER — LUBIPROSTONE 24 MCG PO CAPS
24.0000 ug | ORAL_CAPSULE | Freq: Every day | ORAL | Status: DC
  Administered 2015-11-03 – 2015-11-07 (×5): 24 ug via ORAL
  Filled 2015-11-02 (×5): qty 1

## 2015-11-02 MED ORDER — ASPIRIN EC 81 MG PO TBEC: 81.0000 mg | DELAYED_RELEASE_TABLET | Freq: Every day | ORAL | Status: DC

## 2015-11-02 MED ORDER — ASPIRIN 81 MG PO CHEW
324.0000 mg | CHEWABLE_TABLET | Freq: Once | ORAL | Status: AC
  Administered 2015-11-02: 324 mg via ORAL
  Filled 2015-11-02: qty 4

## 2015-11-02 MED ORDER — INSULIN GLARGINE 100 UNIT/ML ~~LOC~~ SOLN
12.0000 [IU] | Freq: Every day | SUBCUTANEOUS | Status: DC
  Administered 2015-11-02 – 2015-11-04 (×3): 12 [IU] via SUBCUTANEOUS
  Filled 2015-11-02 (×3): qty 0.12

## 2015-11-02 MED ORDER — HEPARIN BOLUS VIA INFUSION
4000.0000 [IU] | Freq: Once | INTRAVENOUS | Status: DC
  Filled 2015-11-02: qty 4000

## 2015-11-02 MED ORDER — INSULIN ASPART 100 UNIT/ML ~~LOC~~ SOLN: 0.0000 [IU] | Freq: Three times a day (TID) | SUBCUTANEOUS | Status: DC

## 2015-11-02 MED ORDER — TRAZODONE HCL 50 MG PO TABS
50.0000 mg | ORAL_TABLET | Freq: Every evening | ORAL | Status: DC | PRN
  Administered 2015-11-06: 50 mg via ORAL
  Filled 2015-11-02: qty 1

## 2015-11-02 MED ORDER — ACETAMINOPHEN 325 MG PO TABS: 650.0000 mg | ORAL_TABLET | Freq: Four times a day (QID) | ORAL | Status: DC | PRN

## 2015-11-02 MED ORDER — INSULIN ASPART 100 UNIT/ML ~~LOC~~ SOLN: 0.0000 [IU] | Freq: Every day | SUBCUTANEOUS | Status: DC

## 2015-11-02 MED ORDER — ENOXAPARIN SODIUM 40 MG/0.4ML ~~LOC~~ SOLN: 30.0000 mg | SUBCUTANEOUS | Status: DC

## 2015-11-02 MED ORDER — ACETAMINOPHEN 650 MG RE SUPP: 650.0000 mg | Freq: Four times a day (QID) | RECTAL | Status: DC | PRN

## 2015-11-02 MED ORDER — ATORVASTATIN CALCIUM 20 MG PO TABS
40.0000 mg | ORAL_TABLET | Freq: Every day | ORAL | Status: DC
  Administered 2015-11-03 – 2015-11-06 (×3): 40 mg via ORAL
  Filled 2015-11-02 (×4): qty 2

## 2015-11-02 MED ORDER — ONDANSETRON HCL 4 MG PO TABS: 4.0000 mg | ORAL_TABLET | Freq: Four times a day (QID) | ORAL | Status: DC | PRN

## 2015-11-02 MED ORDER — HYDRALAZINE HCL 20 MG/ML IJ SOLN
10.0000 mg | Freq: Four times a day (QID) | INTRAMUSCULAR | Status: DC | PRN
  Administered 2015-11-02 (×2): 10 mg via INTRAVENOUS
  Filled 2015-11-02: qty 1
  Filled 2015-11-02: qty 0.5
  Filled 2015-11-02: qty 1

## 2015-11-02 MED ORDER — HEPARIN (PORCINE) IN NACL 100-0.45 UNIT/ML-% IJ SOLN
1100.0000 [IU]/h | INTRAMUSCULAR | Status: DC
  Filled 2015-11-02: qty 250

## 2015-11-02 MED ORDER — SODIUM CHLORIDE 0.9 % IV SOLN
INTRAVENOUS | Status: AC
  Administered 2015-11-02: 17:00:00 via INTRAVENOUS

## 2015-11-02 MED ORDER — INSULIN ASPART 100 UNIT/ML ~~LOC~~ SOLN: 6.0000 [IU] | Freq: Three times a day (TID) | SUBCUTANEOUS | Status: DC

## 2015-11-02 MED ORDER — FINASTERIDE 5 MG PO TABS
5.0000 mg | ORAL_TABLET | Freq: Every day | ORAL | Status: DC
  Administered 2015-11-03 – 2015-11-07 (×5): 5 mg via ORAL
  Filled 2015-11-02 (×5): qty 1

## 2015-11-02 MED ORDER — DOCUSATE SODIUM 100 MG PO CAPS
100.0000 mg | ORAL_CAPSULE | Freq: Two times a day (BID) | ORAL | Status: DC
  Administered 2015-11-02 – 2015-11-07 (×9): 100 mg via ORAL
  Filled 2015-11-02 (×10): qty 1

## 2015-11-02 MED ORDER — SODIUM CHLORIDE 0.9 % IV BOLUS (SEPSIS)
1000.0000 mL | Freq: Once | INTRAVENOUS | Status: AC
  Administered 2015-11-02: 1000 mL via INTRAVENOUS

## 2015-11-02 MED ORDER — INSULIN ASPART 100 UNIT/ML ~~LOC~~ SOLN
0.0000 [IU] | Freq: Every day | SUBCUTANEOUS | Status: DC
Start: 2015-11-02 — End: 2015-11-02

## 2015-11-02 MED ORDER — SERTRALINE HCL 50 MG PO TABS
50.0000 mg | ORAL_TABLET | Freq: Every day | ORAL | Status: DC
  Administered 2015-11-02 – 2015-11-06 (×5): 50 mg via ORAL
  Filled 2015-11-02 (×5): qty 1

## 2015-11-02 MED ORDER — TERAZOSIN HCL 2 MG PO CAPS
2.0000 mg | ORAL_CAPSULE | Freq: Every day | ORAL | Status: DC
  Administered 2015-11-02 – 2015-11-06 (×5): 2 mg via ORAL
  Filled 2015-11-02 (×6): qty 1

## 2015-11-02 MED ORDER — LEVETIRACETAM 500 MG PO TABS
1000.0000 mg | ORAL_TABLET | Freq: Two times a day (BID) | ORAL | Status: DC
  Administered 2015-11-02 – 2015-11-07 (×10): 1000 mg via ORAL
  Filled 2015-11-02 (×10): qty 2

## 2015-11-02 MED ORDER — INSULIN ASPART 100 UNIT/ML ~~LOC~~ SOLN
0.0000 [IU] | Freq: Three times a day (TID) | SUBCUTANEOUS | Status: DC
  Administered 2015-11-02: 9 [IU] via SUBCUTANEOUS
  Administered 2015-11-03: 5 [IU] via SUBCUTANEOUS
  Administered 2015-11-03: 1 [IU] via SUBCUTANEOUS
  Administered 2015-11-03: 3 [IU] via SUBCUTANEOUS
  Administered 2015-11-04: 1 [IU] via SUBCUTANEOUS
  Administered 2015-11-04 (×2): 5 [IU] via SUBCUTANEOUS
  Administered 2015-11-05: 9 [IU] via SUBCUTANEOUS
  Administered 2015-11-06: 5 [IU] via SUBCUTANEOUS
  Administered 2015-11-06: 2 [IU] via SUBCUTANEOUS
  Administered 2015-11-06 – 2015-11-07 (×2): 5 [IU] via SUBCUTANEOUS
  Filled 2015-11-02: qty 5
  Filled 2015-11-02: qty 1
  Filled 2015-11-02 (×2): qty 5
  Filled 2015-11-02: qty 2
  Filled 2015-11-02: qty 9
  Filled 2015-11-02 (×3): qty 5
  Filled 2015-11-02: qty 9
  Filled 2015-11-02: qty 2

## 2015-11-02 NOTE — ED Notes (Signed)
MD at bedside before RN assessment complete, see note.

## 2015-11-02 NOTE — ED Provider Notes (Signed)
[facility] Emergency Department Provider Note   ____________________________________________  Time seen: Approximately 11:11 AM  I have reviewed the triage vital signs and the nursing notes.   HISTORY  Chief Complaint Seizures    HPI Jeffery Schneider is a 70 y.o. male with hypertension, diabetes, brain tumor status post resection, CVA with chronic right sided weakness, CKD, hyperlipidemia, gastric reflux, seizure disorder, syncope who presents for an episode of decreased responsiveness that occurred at approximately 10 AM, gradual onset, now resolved, initially severe. Wife at bedside reports that the patient was eating breakfast when he suddenly slumped to the right, appeared diaphoretic, was not responding to her, his heart rate increase in his blood pressure decreased. This lasted approximately 5-10 minutes. She is unsure whether not this was a syncopal episode or a seizure. He has had generalized tonic-clonic seizures as well as absence seizures in the past. She reports that he has had no cough, vomiting, diarrhea, fevers or chills. He was seen in this emergency department on 10/30/2015 due to concern for dehydration. He denies any pain complaints such as chest pain, abdominal pain, no shortness of breath.   Past Medical History  Diagnosis Date  . Hypertension   . Diabetes mellitus without complication ([place])   . Brain tumor ([place])   . Stroke ([place])   . Kidney disease   . Hyperlipidemia   . Bradycardia   . Syncope   . Abnormal EKG   . Dementia   . GERD (gastroesophageal reflux disease)   . Dysphagia   . Diabetes ([place])   . Constipation   . Seizures ([place])   . Sickle cell trait ([place])   . Benign prostatic hyperplasia   . Chronic kidney disease     Patient Active Problem List   Diagnosis Date Noted  . Syncope 11/02/2015  . Weakness 11/23/2014  . CKD (chronic kidney disease) 11/23/2014  . Seizure ([place]) 11/23/2014  . TIA (transient ischemic attack)   .  Benign essential HTN   . Chest pain 07/19/2014  . Dysphagia   . Diabetes ([place])   . Constipation     Past Surgical History  Procedure Laterality Date  . Cardiac catheterization    . Craniotomy for tumor      frontal lobe    No current outpatient prescriptions on file.  Allergies Ace inhibitors; Calcitonin; Coffee bean extract; Influenza vaccines; Lisinopril; Loratadine; Penicillins; Sulfa antibiotics; Actos; Codeine; and Streptomycin  Family History  Problem Relation Age of Onset  . Heart attack Mother   . Heart attack Father   . Thyroid cancer Sister   . Diabetes Sister   . Heart attack Brother     Social History Social History  Substance Use Topics  . Smoking status: Never Smoker   . Smokeless tobacco: None  . Alcohol Use: No    Review of Systems Constitutional: No fever/chills Eyes: No visual changes. ENT: No sore throat. Cardiovascular: Denies chest pain. Respiratory: Denies shortness of breath. Gastrointestinal: No abdominal pain.  No nausea, no vomiting.  No diarrhea.  No constipation. Genitourinary: Negative for dysuria. Musculoskeletal: Negative for back pain. Skin: Negative for rash. Neurological: Negative for headaches, focal weakness or numbness.  10-point ROS otherwise negative.  ____________________________________________   PHYSICAL EXAM:  Filed Vitals:   11/02/15 1620 11/02/15 1736 11/02/15 1828 11/02/15 1957  BP: 194/67 188/68 163/53 160/55  Pulse:  48 53 69  Temp:  97.4 F (36.3 C)  98.2 F (36.8 C)  TempSrc:  Oral  Oral  Resp:  17  16  Height:  5\' 9"  (1.753 m)    Weight:  182 lb 9.6 oz (82.827 kg)    SpO2:  97%  100%     Constitutional: Alert and oriented. Well appearing and in no acute distress. Eyes: Conjunctivae are normal. PERRL. EOMI. Head: Atraumatic. Nose: No congestion/rhinnorhea. Mouth/Throat: Mucous membranes are moist.  Oropharynx non-erythematous. Neck: No stridor. Supple without meningismus. Cardiovascular:  Normal rate, regular rhythm. Grossly normal heart sounds.  Good peripheral circulation. Respiratory: Normal respiratory effort.  No retractions. Lungs CTAB. Gastrointestinal: Soft and nontender. No distention.  No CVA tenderness. Genitourinary: deferred Musculoskeletal: No lower extremity tenderness nor edema.  No joint effusions. Neurologic:  Normal speech and language. 4/5 strength in the right upper and right lower extremity, sensation intact to light touch throughout all extremities, 5 out of 5 strength in the left upper and left lower extremity. Skin:  Skin is warm, dry and intact. No rash noted. Psychiatric: Mood and affect are normal. Speech and behavior are normal.  ____________________________________________   LABS (all labs ordered are listed, but only abnormal results are displayed)  Labs Reviewed  CBC WITH DIFFERENTIAL/PLATELET - Abnormal; Notable for the following:    RBC 4.05 (*)    Hemoglobin 10.9 (*)    HCT 32.6 (*)    Platelets 87 (*)    Lymphs Abs 0.9 (*)    All other components within normal limits  COMPREHENSIVE METABOLIC PANEL - Abnormal; Notable for the following:    Glucose, Bld 226 (*)    BUN 22 (*)    Creatinine, Ser 1.40 (*)    ALT 13 (*)    GFR calc non Af Amer 49 (*)    GFR calc Af Amer 57 (*)    All other components within normal limits  GLUCOSE, CAPILLARY - Abnormal; Notable for the following:    Glucose-Capillary 198 (*)    All other components within normal limits  TROPONIN I  TROPONIN I  PROTIME-INR  APTT  URINALYSIS COMPLETEWITH MICROSCOPIC ([facility] ONLY)  CBC  COMPREHENSIVE METABOLIC PANEL  LIPID PANEL  CBG MONITORING, ED   ____________________________________________  EKG  ED ECG REPORT I, Joanne Gavel, the attending physician, personally viewed and interpreted this ECG.   Date: 11/02/2015  EKG Time: 11:14  Rate: 47  Rhythm: normal EKG, normal sinus rhythm, unchanged from previous tracings, sinus bradycardia  Axis: normal   Intervals:left anterior fascicular block  ST&T Change: No acute ST elevation. T-wave inversions seen in 2, 3, aVF, V4, V5, V6 are unchanged from 09/16/2015.  ____________________________________________  RADIOLOGY  CXR IMPRESSION: No active cardiopulmonary disease.  CT head IMPRESSION: No acute intracranial abnormality. Stable atrophy and chronic white matter disease. No definite acute cortical infarction. Stable postsurgical changes left frontal lobe. No definite recurrent mass is noted on this unenhanced scan.  ____________________________________________   PROCEDURES  Procedure(s) performed: None  Critical Care performed: No  ____________________________________________   INITIAL IMPRESSION / ASSESSMENT AND PLAN / ED COURSE  Pertinent labs & imaging results that were available during my care of the patient were reviewed by me and considered in my medical decision making (see chart for details).  Jeffery Schneider is a 70 y.o. male with hypertension, diabetes, brain tumor status post resection, CVA, CKD, hyperlipidemia, gastric reflux, seizure disorder, syncope who presents for an episode of decreased responsiveness that occurred at approximately 10 AM. On exam, he is generally well-appearing and in no acute distress. His vital signs are stable although he has mild  bradycardia but the patient's wife reports that this is his baseline. The remainder of his vital signs are stable and he is afebrile. His neurological exam is unchanged from prior. EKG is also unchanged from prior. I suspect that this was actually a syncopal episode today as opposed to a seizure. We'll obtain screening labs as well as CT head, chest x-ray, check urinalysis, reassess for disposition.  ----------------------------------------- 1:38 PM on 11/02/2015 ----------------------------------------- Labs and imaging reviewed by me. Creatinine mildly elevated at 1.40 however that appears to be his baseline. CBC with  mild anemia, hemoglobin 10.9. Troponin is elevated at 0.32. Concern for an STEMI, possibly cardiogenic cause of his syncope this morning. Currently he is asymptomatic. Will give aspirin. CT head and chest x-ray are negative for any acute . Case discussed with the hospitalist, Dr. Margaretmary Eddy for admission at this time.  ----------------------------------------- 8:20 PM on 11/02/2015 -----------------------------------------  Addendum: After the patient was admitted, lab called to inform us that the initial elevated troponin was actually reported on the wrong patient. This patient's initial troponin was negative. Please remove "NSTEMI" from clinical impression.   ____________________________________________   FINAL CLINICAL IMPRESSION(S) / ED DIAGNOSES  Final diagnoses:  NSTEMI (non-ST elevated myocardial infarction) ([place])  Syncope, unspecified syncope type      NEW MEDICATIONS STARTED DURING THIS VISIT:  Current Discharge Medication List       Note:  This document was prepared using Dragon voice recognition software and may include unintentional dictation errors.    Joanne Gavel, MD 11/02/15 2021

## 2015-11-02 NOTE — Progress Notes (Signed)
Alert to self. Room air. Jeffery Schneider. Pt reports no pain. Family at the bedside. MD Fath talked to wife. NS @ 75. Pt has no further concerns at this time.

## 2015-11-02 NOTE — H&P (Signed)
[facility] [place] NAME: Jeffery Schneider    MR#:  [id number]  DATE OF BIRTH:  1946-05-01  DATE OF ADMISSION:  11/02/2015  PRIMARY CARE PHYSICIAN: [name], MD   REQUESTING/REFERRING PHYSICIAN: Harvest Dark, MD  CHIEF COMPLAINT:   Syncope HISTORY OF PRESENT ILLNESS:  Jeffery Schneider  is a 70 y.o. 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 Patient is unable to provide any history from the previous hemorrhagic stroke. According to the wife patient has been urinating a lot for the past 2 weeks, and patient was evaluated by the endocrinologist today at the [place] . He had blood work done and eventually patient's wife has received a phone call reporting that patient is dehydrated and needs IV fluids. Also patient passed out at home as reported by the wife. Patient was found to be bradycardic in the hospital but initial troponin was negative. CT head is negative  PAST MEDICAL HISTORY:   Past Medical History  Diagnosis Date  . Hypertension   . Diabetes mellitus without complication ([place])   . Brain tumor ([place])   . Stroke ([place])   . Kidney disease   . Hyperlipidemia   . Bradycardia   . Syncope   . Abnormal EKG   . Dementia   . GERD (gastroesophageal reflux disease)   . Dysphagia   . Diabetes ([place])   . Constipation   . Seizures ([place])   . Sickle cell trait ([place])   . Benign prostatic hyperplasia   . Chronic kidney disease     PAST SURGICAL HISTOIRY:   Past Surgical History  Procedure Laterality Date  . Cardiac catheterization    . Craniotomy for tumor      frontal lobe    SOCIAL HISTORY:   Social History  Substance Use Topics  . Smoking  status: Never Smoker   . Smokeless tobacco: Not on file  . Alcohol Use: No    FAMILY HISTORY:   Family History  Problem Relation Age of Onset  . Heart attack Mother   . Heart attack Father   . Thyroid cancer Sister   . Diabetes Sister   . Heart attack Brother     DRUG ALLERGIES:   Allergies  Allergen Reactions  . Ace Inhibitors   . Calcitonin   . Coffee Bean Extract [Coffea Arabica]     Per daughter  . Influenza Vaccines Nausea And Vomiting  . Lisinopril Other (See Comments)    Sees black spots  . Loratadine Itching  . Penicillins Other (See Comments)    Fainting.   . Sulfa Antibiotics Other (See Comments)    Fainting.   . Actos [Pioglitazone] Itching and Rash  . Codeine Rash  . Streptomycin Rash    REVIEW OF SYSTEMS:  Review of systems unobtainable from the patient residual deficits from the previous stroke   MEDICATIONS AT HOME:   Prior to Admission medications   Medication Sig Start Date End Date Taking? Authorizing Provider  finasteride (PROSCAR) 5 MG tablet Take 5 mg by mouth daily. 11/17/14  Yes Historical Provider, MD  insulin aspart (NOVOLOG) 100 UNIT/ML injection Inject 6-7 Units into the skin 3 (three) times daily before meals. Sliding scale: 151-200=1 unit, 201-250=2 unit, 251-300=3  units, 301-350=4 units, 351-400=5 units, over 400 call dr   Yes Historical Provider, MD  insulin glargine (LANTUS) 100 UNIT/ML injection Inject 12 Units into the skin at bedtime.   Yes Historical Provider, MD  levETIRAcetam (KEPPRA) 1000 MG tablet Take 1,000 mg by mouth 2 (two) times daily.    Yes Historical Provider, MD  lubiprostone (AMITIZA) 24 MCG capsule Take 24 mcg by mouth daily with breakfast.    Yes Historical Provider, MD  Melatonin 2.5 MG CAPS Take 2.5 mg by mouth daily.    Yes Historical Provider, MD  pantoprazole (PROTONIX) 40 MG tablet Take 40 mg by mouth daily.   Yes Historical Provider, MD  sertraline (ZOLOFT) 100 MG tablet Take 50 mg by mouth at bedtime.    Yes  Historical Provider, MD  terazosin (HYTRIN) 2 MG capsule Take 2 mg by mouth at bedtime.   Yes Historical Provider, MD  traZODone (DESYREL) 50 MG tablet Take 50 mg by mouth at bedtime as needed for sleep.   Yes Historical Provider, MD      VITAL SIGNS:  Blood pressure 163/53, pulse 53, temperature 97.4 F (36.3 C), temperature source Oral, resp. rate 17, height 5\' 9"  (1.753 m), weight 82.827 kg (182 lb 9.6 oz), SpO2 97 %.  PHYSICAL EXAMINATION:  GENERAL:  70 y.o.-year-old patient lying in the bed with no acute distress.  EYES: Pupils equal, round, reactive to light and accommodation. No scleral icterus. Extraocular muscles intact.  HEENT: Head atraumatic, normocephalic. Oropharynx and nasopharynx clear. Dry mucous membranes NECK:  Supple, no jugular venous distention. No thyroid enlargement, no tenderness.  LUNGS: Normal breath sounds bilaterally, no wheezing, rales,rhonchi or crepitation. No use of accessory muscles of respiration.  CARDIOVASCULAR: S1, S2 normal. No murmurs, rubs, or gallops.  ABDOMEN: Soft, nontender, nondistended. Bowel sounds present. No organomegaly or mass.  EXTREMITIES: No pedal edema, cyanosis, or clubbing.  NEUROLOGIC: Patient is awake and alert but oriented 1  SKIN: No obvious rash, lesion, or ulcer.   LABORATORY PANEL:   CBC  Recent Labs Lab 11/02/15 1127  WBC 4.0  HGB 10.9*  HCT 32.6*  PLT 87*   ------------------------------------------------------------------------------------------------------------------  Chemistries   Recent Labs Lab 11/02/15 1127  NA 144  K 3.8  CL 108  CO2 31  GLUCOSE 226*  BUN 22*  CREATININE 1.40*  CALCIUM 9.5  AST 16  ALT 13*  ALKPHOS 73  BILITOT 0.5   ------------------------------------------------------------------------------------------------------------------  Cardiac Enzymes  Recent Labs Lab 11/02/15 1351  TROPONINI <0.03    ------------------------------------------------------------------------------------------------------------------  RADIOLOGY:  Dg Chest 2 View  11/02/2015  CLINICAL DATA:  Seizure. EXAM: CHEST  2 VIEW COMPARISON:  September 16, 2015. FINDINGS: Stable cardiomediastinal silhouette. Atherosclerosis of thoracic aorta is noted. No pneumothorax or pleural effusion is noted. Both lungs are clear. The visualized skeletal structures are unremarkable. IMPRESSION: No active cardiopulmonary disease. Electronically Signed   By: Marijo Conception, M.D.   On: 11/02/2015 12:23   Ct Head Wo Contrast  11/02/2015  CLINICAL DATA:  Seizure today, history of seizures, syncope EXAM: CT HEAD WITHOUT CONTRAST TECHNIQUE: Contiguous axial images were obtained from the base of the skull through the vertex without intravenous contrast. COMPARISON:  09/17/2015 FINDINGS: Brain: No intracranial hemorrhage, mass effect or midline shift. Stable encephalomalacia from prior tumor resection in left frontal lobe. Ex vacuo dilatation of left frontal horn again noted. Ventricular size is stable from prior exam. No definite recurrent mass is noted on this unenhanced scan. Stable atrophy and chronic white matter disease.  No definite acute cortical infarction. No intraventricular hemorrhage. Vascular: Extensive atherosclerotic calcifications of carotid siphon again noted. Skull: Again noted status post left frontal craniotomy. No skull fracture is noted. Sinuses/Orbits: The mastoid air cells and visualized sphenoid sinuses are unremarkable. Other: None IMPRESSION: No acute intracranial abnormality. Stable atrophy and chronic white matter disease. No definite acute cortical infarction. Stable postsurgical changes left frontal lobe. No definite recurrent mass is noted on this unenhanced scan. Electronically Signed   By: Lahoma Crocker M.D.   On: 11/02/2015 12:37    EKG:   Orders placed or performed during the hospital encounter of 09/16/15  . ED EKG  within 10 minutes  . ED EKG within 10 minutes  . EKG 12-Lead  . EKG 12-Lead    IMPRESSION AND PLAN:     #syncope probably from dehydration / bradycardia  CT head is negative   monitor patient on telemetry Cycle cardiac biomarkers and provide IV fluids Monitor heart rate and avoid rate limiting drugs Check orthostatics PT consult If necessary neurology consult Cardiac consult is placed and Dr. Ubaldo Glassing  is aware As reported by the wife patient was bradycardic in the past but pacemaker was not placed as the patient is not a good surgical candidate  #Sinus bradycardia Avoid rate limiting drugs and monitor patient on telemetry. Cardiac consult is placed. We will obtain echocardiogram  #Acute kidney injury from dehydration on CKD Provided gentle hydration with IV fluids and monitor renal function closely. Check urinalysis Will consider renal ultrasound and nephrology consult if no improvement  #Frequent urination Will check urinalysis and try to obtain medical records from [place] from the endocrinology in a.m.  #History of seizures continue home medication Keppra  #Diabetes mellitus provide sliding scale insulin that and diabetic diet Resume home medications and monitor sugars closely    All the records are reviewed and case discussed with ED provider. Management plans discussed with the patient's wife and she is  in agreement.  CODE STATUS: Partial code with most form, wife is the healthcare power of attorney  TOTAL TIME TAKING CARE OF THIS PATIENT: 90minutes.    Nicholes Mango M.D on 11/02/2015 at 7:45 PM  Between 7am to 6pm - Pager - [phone]  After 6pm go to [email or web address] - password [email or web address] [facility]  Office  [phone]  CC: Primary care physician; [name], MD

## 2015-11-02 NOTE — ED Notes (Signed)
Admitting MD at bedside.

## 2015-11-02 NOTE — Progress Notes (Signed)
ANTICOAGULATION CONSULT NOTE - Initial Consult  Pharmacy Consult for Heparin Indication: chest pain/ACS  Allergies  Allergen Reactions  . Ace Inhibitors   . Calcitonin   . Coffee Bean Extract [Coffea Arabica]     Per daughter  . Influenza Vaccines Nausea And Vomiting  . Lisinopril Other (See Comments)    Sees black spots  . Loratadine Itching  . Penicillins Other (See Comments)    Fainting.   . Sulfa Antibiotics Other (See Comments)    Fainting.   . Actos [Pioglitazone] Itching and Rash  . Codeine Rash  . Streptomycin Rash    Patient Measurements:   Heparin Dosing Weight: 85.3 kg  Vital Signs: Temp: 97.7 F (36.5 C) (05/11 1123) Temp Source: Oral (05/11 1123) BP: 162/56 mmHg (05/11 1200) Pulse Rate: 55 (05/11 1200)  Labs:  Recent Labs  11/02/15 1127  HGB 10.9*  HCT 32.6*  PLT 87*  CREATININE 1.40*  TROPONINI 0.32*    Estimated Creatinine Clearance: 50 mL/min (by C-G formula based on Cr of 1.4).   Medical History: Past Medical History  Diagnosis Date  . Hypertension   . Diabetes mellitus without complication ([place])   . Brain tumor ([place])   . Stroke ([place])   . Kidney disease   . Hyperlipidemia   . Bradycardia   . Syncope   . Abnormal EKG   . Dementia   . GERD (gastroesophageal reflux disease)   . Dysphagia   . Diabetes ([place])   . Constipation   . Seizures ([place])   . Sickle cell trait ([place])   . Benign prostatic hyperplasia   . Chronic kidney disease     Medications:   (Not in a hospital admission) Scheduled:  . heparin  4,000 Units Intravenous Once   Infusions:  . heparin      Assessment: 70 y/o M admitted with admitted with decreased responsiveness to begin heparin drip for NSTEMI. Patient not on anticoagulants PTA per chart/family.   Goal of Therapy:  Heparin level 0.3-0.7 units/ml Monitor platelets by anticoagulation protocol: Yes   Plan:  Give 4000 units bolus x 1 Start heparin infusion at 1100 units/hr Check anti-Xa level in 8  hours and daily while on heparin Continue to monitor H&H and platelets  Ulice Dash D 11/02/2015,1:46 PM

## 2015-11-02 NOTE — ED Notes (Signed)
Pt. Wife states that today about 1005 pt experienced a seizure and was unable to come back as quick as he usually does following a seizure so she called 911. Pt. Wife states that pt. Continued to lose consciousness. Pt. A&Ox1, wife states that is often baseline at home. Seizure activity lasted approx 5 minutes, wife states pt. Slumped over in chair to the rt. Side and became uncomprehensable to speech, and diaphoretic.

## 2015-11-03 ENCOUNTER — Observation Stay
Admit: 2015-11-03 | Discharge: 2015-11-03 | Disposition: A | Discharge status: Home / Self Care | Payer: Medicare HMO | Attending: Cardiology | Admitting: Cardiology

## 2015-11-03 DIAGNOSIS — R55 Syncope and collapse: Secondary | ICD-10-CM

## 2015-11-03 DIAGNOSIS — I214 Non-ST elevation (NSTEMI) myocardial infarction: Secondary | ICD-10-CM | POA: Diagnosis not present

## 2015-11-03 LAB — COMPREHENSIVE METABOLIC PANEL
ALBUMIN: 3.4 g/dL — AB (ref 3.5–5.0)
ALT: 12 U/L — AB (ref 17–63)
AST: 14 U/L — AB (ref 15–41)
Alkaline Phosphatase: 63 U/L (ref 38–126)
Anion gap: 4 — ABNORMAL LOW (ref 5–15)
BUN: 19 mg/dL (ref 6–20)
CHLORIDE: 114 mmol/L — AB (ref 101–111)
CO2: 28 mmol/L (ref 22–32)
CREATININE: 1.27 mg/dL — AB (ref 0.61–1.24)
Calcium: 8.8 mg/dL — ABNORMAL LOW (ref 8.9–10.3)
GFR, EST NON AFRICAN AMERICAN: 55 mL/min — AB (ref >60)
Glucose, Bld: 124 mg/dL — ABNORMAL HIGH (ref 65–99)
POTASSIUM: 3.4 mmol/L — AB (ref 3.5–5.1)
Sodium: 146 mmol/L — ABNORMAL HIGH (ref 135–145)
Total Bilirubin: 0.5 mg/dL (ref 0.3–1.2)
Total Protein: 6 g/dL — ABNORMAL LOW (ref 6.5–8.1)

## 2015-11-03 LAB — GLUCOSE, CAPILLARY
GLUCOSE-CAPILLARY: 274 mg/dL — AB (ref 65–99)
Glucose-Capillary: 124 mg/dL — ABNORMAL HIGH (ref 65–99)
Glucose-Capillary: 224 mg/dL — ABNORMAL HIGH (ref 65–99)
Glucose-Capillary: 367 mg/dL — ABNORMAL HIGH (ref 65–99)
Glucose-Capillary: 74 mg/dL (ref 65–99)

## 2015-11-03 LAB — CBC
HEMATOCRIT: 29.4 % — AB (ref 40.0–52.0)
HEMOGLOBIN: 10.1 g/dL — AB (ref 13.0–18.0)
MCH: 27.4 pg (ref 26.0–34.0)
MCHC: 34.4 g/dL (ref 32.0–36.0)
MCV: 79.5 fL — ABNORMAL LOW (ref 80.0–100.0)
Platelets: 89 10*3/uL — ABNORMAL LOW (ref 150–440)
RBC: 3.7 MIL/uL — AB (ref 4.40–5.90)
RDW: 14.2 % (ref 11.5–14.5)
WBC: 5 10*3/uL (ref 3.8–10.6)

## 2015-11-03 LAB — ECHOCARDIOGRAM COMPLETE
Height: 69 in
Weight: 2919.24 oz

## 2015-11-03 LAB — LIPID PANEL
CHOLESTEROL: 212 mg/dL — AB (ref 0–200)
HDL: 52 mg/dL (ref >40)
LDL Cholesterol: 146 mg/dL — ABNORMAL HIGH (ref 0–99)
Total CHOL/HDL Ratio: 4.1 RATIO
Triglycerides: 70 mg/dL (ref <150)
VLDL: 14 mg/dL (ref 0–40)

## 2015-11-03 MED ORDER — POTASSIUM CHLORIDE CRYS ER 10 MEQ PO TBCR
10.0000 meq | EXTENDED_RELEASE_TABLET | Freq: Every day | ORAL | Status: DC
  Administered 2015-11-04 – 2015-11-05 (×2): 10 meq via ORAL
  Filled 2015-11-03 (×2): qty 1

## 2015-11-03 MED ORDER — POTASSIUM CHLORIDE CRYS ER 20 MEQ PO TBCR
40.0000 meq | EXTENDED_RELEASE_TABLET | Freq: Once | ORAL | Status: AC
  Administered 2015-11-03: 40 meq via ORAL
  Filled 2015-11-03: qty 2

## 2015-11-03 NOTE — Consult Note (Signed)
[facility]  CARDIOLOGY CONSULT NOTE  Patient ID: Jeffery Schneider MRN: [id number] DOB/AGE: 1946/06/08 70 y.o.  Admit date: 11/02/2015 Referring Physician Dr. Margaretmary Eddy Primary Physician [facility] Primary Cardiologist [facility] Reason for Consultation syncope  HPI: Pt is a 70 yo male with history of meningioma s/p resection with history of hemorrhagic stroke and seizure disorder who is followed [facility] for bradycardia. Pt was last seen there in 2/17 for second opinion regarding pacemaker which was recommended by another cardiologist. He has chronic sinus bradycardia and has resting heart rates in the 43-48 range for several years. Was evaluated by [facility]  Which revealed sinus bradycardia with 1st degree av block but no higher degree av block. He hasd episodes of nonsustained svt. Echo has revaled ef of 55-60%. He was asdmitted after being noted to be unresponsive by his wife on am of admision. It is unclear if he had any of his seizure activity. In the er was noted to be in sinus bradycardia. He was hemodynaically stable. He was incontinent of urine. Much of history obtained from wife. Troponins have been normal thus far.   Review of Systems  Unable to perform ROS: mental acuity    Past Medical History  Diagnosis Date  . Hypertension   . Diabetes mellitus without complication ([place])   . Brain tumor ([place])   . Stroke ([place])   . Kidney disease   . Hyperlipidemia   . Bradycardia   . Syncope   . Abnormal EKG   . Dementia   . GERD (gastroesophageal reflux disease)   . Dysphagia   . Diabetes ([place])   . Constipation   . Seizures ([place])   . Sickle cell trait ([place])   . Benign prostatic hyperplasia   . Chronic kidney disease     Family History  Problem Relation Age of Onset  . Heart attack Mother   . Heart attack Father   . Thyroid cancer Sister   . Diabetes Sister   . Heart attack Brother     Social History   Social History  . Marital Status:  Married    Spouse Name: N/A  . Number of Children: N/A  . Years of Education: N/A   Occupational History  . Not on file.   Social History Main Topics  . Smoking status: Never Smoker   . Smokeless tobacco: Not on file  . Alcohol Use: No  . Drug Use: No  . Sexual Activity: Not on file   Other Topics Concern  . Not on file   Social History Narrative    Past Surgical History  Procedure Laterality Date  . Cardiac catheterization    . Craniotomy for tumor      frontal lobe     Prescriptions prior to admission  Medication Sig Dispense Refill Last Dose  . finasteride (PROSCAR) 5 MG tablet Take 5 mg by mouth daily.  11 11/02/2015 at 1000  . insulin aspart (NOVOLOG) 100 UNIT/ML injection Inject 6-7 Units into the skin 3 (three) times daily before meals. Sliding scale: 151-200=1 unit, 201-250=2 unit, 251-300=3 units, 301-350=4 units, 351-400=5 units, over 400 call dr   11/02/2015 at 1000  . insulin glargine (LANTUS) 100 UNIT/ML injection Inject 12 Units into the skin at bedtime.   11/01/2015 at Unknown time  . levETIRAcetam (KEPPRA) 1000 MG tablet Take 1,000 mg by mouth 2 (two) times daily.    11/02/2015 at 1000  . lubiprostone (AMITIZA) 24 MCG capsule Take 24 mcg  by mouth daily with breakfast.    11/02/2015 at 1000  . Melatonin 2.5 MG CAPS Take 2.5 mg by mouth daily.    11/01/2015 at Unknown time  . pantoprazole (PROTONIX) 40 MG tablet Take 40 mg by mouth daily.   11/02/2015 at 1000  . sertraline (ZOLOFT) 100 MG tablet Take 50 mg by mouth at bedtime.    11/01/2015 at Unknown time  . terazosin (HYTRIN) 2 MG capsule Take 2 mg by mouth at bedtime.   11/01/2015 at Unknown time  . traZODone (DESYREL) 50 MG tablet Take 50 mg by mouth at bedtime as needed for sleep.   prn at prn    Physical Exam: Blood pressure 161/57, pulse 64, temperature 98.2 F (36.8 C), temperature source Oral, resp. rate 18, height 5\' 9"  (1.753 m), weight 82.76 kg (182 lb 7.2 oz), SpO2 99 %.   Wt Readings from Last 1  Encounters:  11/03/15 82.76 kg (182 lb 7.2 oz)     General appearance: slowed mentation Head: Normocephalic, without obvious abnormality, atraumatic Resp: clear to auscultation bilaterally Cardio: sinus bradycardia Neurologic: Mental status: alertness: lethargic  Labs:   Lab Results  Component Value Date   WBC 5.0 11/03/2015   HGB 10.1* 11/03/2015   HCT 29.4* 11/03/2015   MCV 79.5* 11/03/2015   PLT 89* 11/03/2015    Recent Labs Lab 11/03/15 0444  NA 146*  K 3.4*  CL 114*  CO2 28  BUN 19  CREATININE 1.27*  CALCIUM 8.8*  PROT 6.0*  BILITOT 0.5  ALKPHOS 63  ALT 12*  AST 14*  GLUCOSE 124*   Lab Results  Component Value Date   TROPONINI <0.03 11/02/2015      Radiology: no active cardiopulmonary disease EKG: sinus bradycardia  ASSESSMENT AND PLAN:  70 yo with history of meniogoma and history of seizure disorder with history of hemorrhagic bleed who was admitted with a seizure vs syncopal episode. Has chronic sinus bradycardia. No pauses noted thus far. Etiology of event unclear. Seizure vs hypotension. Has ruled out for an mi. Will follow heart rate and hemodynamics. Have discontinued asa and lovenox due to history of hemmorhagic bleed and no obvious ischemia Signed: Teodoro Spray MD, [facility] 11/03/2015, 7:22 AM

## 2015-11-03 NOTE — Care Management Obs Status (Signed)
[facility] NOTIFICATION   Patient Details  Name: Jeffery Schneider MRN: [id number] Date of Birth: Jul 26, 1945   Medicare Observation Status Notification Given:  Yes    Jolly Mango, RN 11/03/2015, 3:11 PM

## 2015-11-03 NOTE — Progress Notes (Signed)
*  PRELIMINARY RESULTS* Echocardiogram 2D Echocardiogram has been performed.  Jeffery Schneider 11/03/2015, 8:29 AM

## 2015-11-03 NOTE — Consult Note (Signed)
Reason for Consult:Syncope Referring Physician: Posey Pronto  CC: Syncope  HPI: Jeffery Schneider is an 70 y.o. male with multiple medical problems including seizures who presents after a syncopal episode.  To be evaluated for possible seizure.  Wife provides much of history.  It appears that the patient was able to get up on the day of admission and was at the breakfast table.  He had just finished eating breakfast and his head fell down to his chest.  He then fell over to the right.  EMS was called at that time and the patient was brought in for evaluation.   The patient was seen at the [place] earlier in the week and told that he was dehydrated.  Patient was to have IV fluids.  It does not appear that this happened.  Per wife seizures in the past have involved some form of shaking.  This did not occur with this event.   Patient is being followed by neurology.    Past Medical History  Diagnosis Date  . Hypertension   . Diabetes mellitus without complication ([place])   . Brain tumor ([place])   . Stroke ([place])   . Kidney disease   . Hyperlipidemia   . Bradycardia   . Syncope   . Abnormal EKG   . Dementia   . GERD (gastroesophageal reflux disease)   . Dysphagia   . Diabetes ([place])   . Constipation   . Seizures ([place])   . Sickle cell trait ([place])   . Benign prostatic hyperplasia   . Chronic kidney disease     Past Surgical History  Procedure Laterality Date  . Cardiac catheterization    . Craniotomy for tumor      frontal lobe    Family History  Problem Relation Age of Onset  . Heart attack Mother   . Heart attack Father   . Thyroid cancer Sister   . Diabetes Sister   . Heart attack Brother     Social History:  reports that he has never smoked. He does not have any smokeless tobacco history on file. He reports that he does not drink alcohol or use illicit drugs.  Allergies  Allergen Reactions  . Ace Inhibitors   . Calcitonin   . Coffee Bean Extract [Coffea Arabica]     Per daughter  .  Influenza Vaccines Nausea And Vomiting  . Lisinopril Other (See Comments)    Sees black spots  . Loratadine Itching  . Penicillins Other (See Comments)    Fainting.   . Sulfa Antibiotics Other (See Comments)    Fainting.   . Actos [Pioglitazone] Itching and Rash  . Codeine Rash  . Streptomycin Rash    Medications:  I have reviewed the patient's current medications. Prior to Admission:  Prescriptions prior to admission  Medication Sig Dispense Refill Last Dose  . finasteride (PROSCAR) 5 MG tablet Take 5 mg by mouth daily.  11 11/02/2015 at 1000  . insulin aspart (NOVOLOG) 100 UNIT/ML injection Inject 6-7 Units into the skin 3 (three) times daily before meals. Sliding scale: 151-200=1 unit, 201-250=2 unit, 251-300=3 units, 301-350=4 units, 351-400=5 units, over 400 call dr   11/02/2015 at 1000  . insulin glargine (LANTUS) 100 UNIT/ML injection Inject 12 Units into the skin at bedtime.   11/01/2015 at Unknown time  . levETIRAcetam (KEPPRA) 1000 MG tablet Take 1,000 mg by mouth 2 (two) times daily.    11/02/2015 at 1000  . lubiprostone (AMITIZA) 24 MCG capsule Take 24 mcg by  mouth daily with breakfast.    11/02/2015 at 1000  . Melatonin 2.5 MG CAPS Take 2.5 mg by mouth daily.    11/01/2015 at Unknown time  . pantoprazole (PROTONIX) 40 MG tablet Take 40 mg by mouth daily.   11/02/2015 at 1000  . sertraline (ZOLOFT) 100 MG tablet Take 50 mg by mouth at bedtime.    11/01/2015 at Unknown time  . terazosin (HYTRIN) 2 MG capsule Take 2 mg by mouth at bedtime.   11/01/2015 at Unknown time  . traZODone (DESYREL) 50 MG tablet Take 50 mg by mouth at bedtime as needed for sleep.   prn at prn   Scheduled: . atorvastatin  40 mg Oral q1800  . docusate sodium  100 mg Oral BID  . finasteride  5 mg Oral Daily  . insulin aspart  0-9 Units Subcutaneous TID AC & HS  . insulin glargine  12 Units Subcutaneous QHS  . levETIRAcetam  1,000 mg Oral BID  . lubiprostone  24 mcg Oral Q breakfast  . Melatonin  2.5 mg Oral  Daily  . pantoprazole  40 mg Oral Daily  . potassium chloride  40 mEq Oral Once  . sertraline  50 mg Oral QHS  . sodium chloride flush  3 mL Intravenous Q12H  . terazosin  2 mg Oral QHS    ROS: History obtained from wife  General ROS: night sweats Psychological ROS: negative for - behavioral disorder, hallucinations, memory difficulties, mood swings or suicidal ideation Ophthalmic ROS: negative for - blurry vision, double vision, eye pain or loss of vision ENT ROS: negative for - epistaxis, nasal discharge, oral lesions, sore throat, tinnitus or vertigo Allergy and Immunology ROS: negative for - hives or itchy/watery eyes Hematological and Lymphatic ROS: negative for - bleeding problems, bruising or swollen lymph nodes Endocrine ROS: negative for - galactorrhea, hair pattern changes, polydipsia/polyuria or temperature intolerance Respiratory ROS: negative for - cough, hemoptysis, shortness of breath or wheezing Cardiovascular ROS: negative for - chest pain, dyspnea on exertion, edema or irregular heartbeat Gastrointestinal ROS: negative for - abdominal pain, diarrhea, hematemesis, nausea/vomiting or stool incontinence Genito-Urinary ROS: excessive urination Musculoskeletal ROS: negative for - joint swelling or muscular weakness Neurological ROS: as noted in HPI Dermatological ROS: negative for rash and skin lesion changes  Physical Examination: Blood pressure 164/58, pulse 55, temperature 98 F (36.7 C), temperature source Oral, resp. rate 20, height 5\' 9"  (1.753 m), weight 82.76 kg (182 lb 7.2 oz), SpO2 98 %.  HEENT-  Normocephalic, no lesions, without obvious abnormality.  Normal external eye and conjunctiva.  Normal TM's bilaterally.  Normal auditory canals and external ears. Normal external nose, mucus membranes and septum.  Normal pharynx. Cardiovascular- S1, S2 normal, pulses palpable throughout   Lungs- chest clear, no wheezing, rales, normal symmetric air entry Abdomen- soft,  non-tender; bowel sounds normal; no masses,  no organomegaly Extremities- no edema Lymph-no adenopathy palpable Musculoskeletal-no joint tenderness, deformity or swelling Skin-warm and dry, no hyperpigmentation, vitiligo, or suspicious lesions  Neurological Examination Mental Status: Alert. Dysarthric.  Able to follow 3 step commands without difficulty. Cranial Nerves: II: Discs flat bilaterally; Visual fields grossly normal, pupils equal, round, reactive to light and accommodation III,IV, VI: ptosis not present, extra-ocular motions intact bilaterally V,VII: smile symmetric, facial light touch sensation normal bilaterally VIII: hearing normal bilaterally IX,X: gag reflex present XI: bilateral shoulder shrug XII: midline tongue extension Motor: Right : Upper extremity   5/5    Left:     Upper extremity  5/5  Lower extremity   5/5     Lower extremity   5/5 Sensory: Pinprick and light touch intact throughout, bilaterally Deep Tendon Reflexes: 2+ in the upper extremities, 1+ at the knees and absent at the AJ's bilaterally Plantars: Flexion bilaterally Cerebellar: Normal finger-to-nose testing bilaterally Gait: not tested due to safety concerns   Laboratory Studies:   Basic Metabolic Panel:  Recent Labs Lab 10/30/15 1338 11/02/15 1127 11/03/15 0444  NA 144 144 146*  K 4.4 3.8 3.4*  CL 107 108 114*  CO2 30 31 28   GLUCOSE 218* 226* 124*  BUN 20 22* 19  CREATININE 1.51* 1.40* 1.27*  CALCIUM 9.4 9.5 8.8*    Liver Function Tests:  Recent Labs Lab 10/30/15 1338 11/02/15 1127 11/03/15 0444  AST 15 16 14*  ALT 15* 13* 12*  ALKPHOS 75 73 63  BILITOT 0.3 0.5 0.5  PROT 6.7 6.8 6.0*  ALBUMIN 3.9 3.7 3.4*   No results for input(s): LIPASE, AMYLASE in the last 168 hours. No results for input(s): AMMONIA in the last 168 hours.  CBC:  Recent Labs Lab 10/30/15 1338 11/02/15 1127 11/03/15 0444  WBC 4.4 4.0 5.0  NEUTROABS 2.9 2.5  --   HGB 11.1* 10.9* 10.1*  HCT  33.3* 32.6* 29.4*  MCV 80.4 80.5 79.5*  PLT 92* 87* 89*    Cardiac Enzymes:  Recent Labs Lab 11/02/15 1127 11/02/15 1351  TROPONINI <0.03 <0.03    BNP: Invalid input(s): POCBNP  CBG:  Recent Labs Lab 11/02/15 1136 11/02/15 2051 11/03/15 0016 11/03/15 0726 11/03/15 1109  GLUCAP 198* 449* 367* 74 124*    Microbiology: No results found for this or any previous visit.  Coagulation Studies:  Recent Labs  11/02/15 1635  LABPROT 14.1  INR 1.07    Urinalysis:  Recent Labs Lab 10/30/15 1500  COLORURINE YELLOW*  LABSPEC 1.015  PHURINE 5.0  GLUCOSEU >1000*  HGBUR 1+*  BILIRUBINUR NEGATIVE  KETONESUR NEGATIVE  PROTEINUR 100*  NITRITE NEGATIVE  LEUKOCYTESUR NEGATIVE    Lipid Panel:     Component Value Date/Time   CHOL 212* 11/03/2015 0444   TRIG 70 11/03/2015 0444   HDL 52 11/03/2015 0444   CHOLHDL 4.1 11/03/2015 0444   VLDL 14 11/03/2015 0444   LDLCALC 146* 11/03/2015 0444    HgbA1C:  Lab Results  Component Value Date   HGBA1C 9.8* 11/23/2014    Urine Drug Screen:     Component Value Date/Time   LABOPIA NONE DETECTED 11/23/2014 1513   COCAINSCRNUR NONE DETECTED 11/23/2014 1513   LABBENZ NONE DETECTED 11/23/2014 1513   AMPHETMU NONE DETECTED 11/23/2014 1513   THCU NONE DETECTED 11/23/2014 1513   LABBARB NONE DETECTED 11/23/2014 1513    Alcohol Level: No results for input(s): ETH in the last 168 hours.   Imaging: Dg Chest 2 View  11/02/2015  CLINICAL DATA:  Seizure. EXAM: CHEST  2 VIEW COMPARISON:  September 16, 2015. FINDINGS: Stable cardiomediastinal silhouette. Atherosclerosis of thoracic aorta is noted. No pneumothorax or pleural effusion is noted. Both lungs are clear. The visualized skeletal structures are unremarkable. IMPRESSION: No active cardiopulmonary disease. Electronically Signed   By: Marijo Conception, M.D.   On: 11/02/2015 12:23   Ct Head Wo Contrast  11/02/2015  CLINICAL DATA:  Seizure today, history of seizures, syncope EXAM:  CT HEAD WITHOUT CONTRAST TECHNIQUE: Contiguous axial images were obtained from the base of the skull through the vertex without intravenous contrast. COMPARISON:  09/17/2015 FINDINGS: Brain: No intracranial hemorrhage,  mass effect or midline shift. Stable encephalomalacia from prior tumor resection in left frontal lobe. Ex vacuo dilatation of left frontal horn again noted. Ventricular size is stable from prior exam. No definite recurrent mass is noted on this unenhanced scan. Stable atrophy and chronic white matter disease. No definite acute cortical infarction. No intraventricular hemorrhage. Vascular: Extensive atherosclerotic calcifications of carotid siphon again noted. Skull: Again noted status post left frontal craniotomy. No skull fracture is noted. Sinuses/Orbits: The mastoid air cells and visualized sphenoid sinuses are unremarkable. Other: None IMPRESSION: No acute intracranial abnormality. Stable atrophy and chronic white matter disease. No definite acute cortical infarction. Stable postsurgical changes left frontal lobe. No definite recurrent mass is noted on this unenhanced scan. Electronically Signed   By: Lahoma Crocker M.D.   On: 11/02/2015 12:37     Assessment/Plan: 70 year old male with a history of hemorrhagic infarct, meningioma, seizures and dementia who presents with a syncopal episode.  Description is not typical for his seizures.  Patient on Keppra.  Head CT personally reviewed and shows no acute changes.  Neurological examination unchanged from previous record review.  Do not suspect that this syncopal episode represents a seizures.  Patient on Keppra 1000mg  BID.  Would continue.  No further neurological work up recommended at this time.    Alexis Goodell, MD Neurology [phone] 11/03/2015, 3:20 PM

## 2015-11-03 NOTE — Care Management Obs Status (Signed)
[place] NOTIFICATION   Patient Details  Name: Jeffery Schneider MRN: [id number] Date of Birth: May 01, 1946   Medicare Observation Status Notification Given:  Yes    Jolly Mango, RN 11/03/2015, 3:11 PM

## 2015-11-03 NOTE — Progress Notes (Signed)
[place] at [facility]                                                                                                                                                                                            Patient Demographics   Jeffery Schneider, is a 70 y.o. male, DOB - 05/11/1946, [id number]  Admit date - 11/02/2015   Admitting Physician Nicholes Mango, MD  Outpatient Primary MD for the patient is [name], MD   LOS -   Subjective: Patient admitted with syncope. He has dementia and unable to provide any review of systems     Review of Systems:   CONSTITUTIONAL: Due to dementia and unable to provide review of systems  Vitals:   Filed Vitals:   11/02/15 1957 11/03/15 0543 11/03/15 0800 11/03/15 1106  BP: 160/55 161/57 165/71 164/58  Pulse: 69 64 66 55  Temp: 98.2 F (36.8 C) 98.2 F (36.8 C) 98.5 F (36.9 C) 98 F (36.7 C)  TempSrc: Oral  Oral Oral  Resp: 16 18 18 20   Height:      Weight:  82.76 kg (182 lb 7.2 oz)    SpO2: 100% 99% 96% 98%    Wt Readings from Last 3 Encounters:  11/03/15 82.76 kg (182 lb 7.2 oz)  10/30/15 85.276 kg (188 lb)  09/16/15 83.915 kg (185 lb)     Intake/Output Summary (Last 24 hours) at 11/03/15 1240 Last data filed at 11/03/15 0830  Gross per 24 hour  Intake      0 ml  Output      0 ml  Net      0 ml    Physical Exam:   GENERAL: Pleasant-appearing in no apparent distress.  HEAD, EYES, EARS, NOSE AND THROAT: Atraumatic, normocephalic. Extraocular muscles are intact. Pupils equal and reactive to light. Sclerae anicteric. No conjunctival injection. No oro-pharyngeal erythema.  NECK: Supple. There is no jugular venous distention. No bruits, no lymphadenopathy, no thyromegaly.  HEART: Regular rate and rhythm,. No murmurs, no rubs, no clicks.  LUNGS: Clear to auscultation bilaterally. No rales or rhonchi. No wheezes.  ABDOMEN: Soft, flat, nontender, nondistended. Has good bowel sounds.  No hepatosplenomegaly appreciated.  EXTREMITIES: No evidence of any cyanosis, clubbing, or peripheral edema.  +2 pedal and radial pulses bilaterally.  NEUROLOGIC: The patient is alert not oriented  SKIN: Moist and warm with no rashes appreciated.  Psych: Not anxious, depressed LN: No inguinal LN enlargement    Antibiotics   Anti-infectives    None      Medications   Scheduled Meds: . atorvastatin  40  mg Oral q1800  . docusate sodium  100 mg Oral BID  . finasteride  5 mg Oral Daily  . insulin aspart  0-9 Units Subcutaneous TID AC & HS  . insulin glargine  12 Units Subcutaneous QHS  . levETIRAcetam  1,000 mg Oral BID  . lubiprostone  24 mcg Oral Q breakfast  . Melatonin  2.5 mg Oral Daily  . pantoprazole  40 mg Oral Daily  . sertraline  50 mg Oral QHS  . sodium chloride flush  3 mL Intravenous Q12H  . terazosin  2 mg Oral QHS   Continuous Infusions:  PRN Meds:.acetaminophen **OR** acetaminophen, hydrALAZINE, ondansetron **OR** ondansetron (ZOFRAN) IV, traZODone   Data Review:   Micro Results No results found for this or any previous visit (from the past 240 hour(s)).  Radiology Reports Dg Chest 2 View  11/02/2015  CLINICAL DATA:  Seizure. EXAM: CHEST  2 VIEW COMPARISON:  September 16, 2015. FINDINGS: Stable cardiomediastinal silhouette. Atherosclerosis of thoracic aorta is noted. No pneumothorax or pleural effusion is noted. Both lungs are clear. The visualized skeletal structures are unremarkable. IMPRESSION: No active cardiopulmonary disease. Electronically Signed   By: Marijo Conception, M.D.   On: 11/02/2015 12:23   Ct Head Wo Contrast  11/02/2015  CLINICAL DATA:  Seizure today, history of seizures, syncope EXAM: CT HEAD WITHOUT CONTRAST TECHNIQUE: Contiguous axial images were obtained from the base of the skull through the vertex without intravenous contrast. COMPARISON:  09/17/2015 FINDINGS: Brain: No intracranial hemorrhage, mass effect or midline shift. Stable  encephalomalacia from prior tumor resection in left frontal lobe. Ex vacuo dilatation of left frontal horn again noted. Ventricular size is stable from prior exam. No definite recurrent mass is noted on this unenhanced scan. Stable atrophy and chronic white matter disease. No definite acute cortical infarction. No intraventricular hemorrhage. Vascular: Extensive atherosclerotic calcifications of carotid siphon again noted. Skull: Again noted status post left frontal craniotomy. No skull fracture is noted. Sinuses/Orbits: The mastoid air cells and visualized sphenoid sinuses are unremarkable. Other: None IMPRESSION: No acute intracranial abnormality. Stable atrophy and chronic white matter disease. No definite acute cortical infarction. Stable postsurgical changes left frontal lobe. No definite recurrent mass is noted on this unenhanced scan. Electronically Signed   By: Lahoma Crocker M.D.   On: 11/02/2015 12:37     CBC  Recent Labs Lab 10/30/15 1338 11/02/15 1127 11/03/15 0444  WBC 4.4 4.0 5.0  HGB 11.1* 10.9* 10.1*  HCT 33.3* 32.6* 29.4*  PLT 92* 87* 89*  MCV 80.4 80.5 79.5*  MCH 26.8 26.9 27.4  MCHC 33.3 33.4 34.4  RDW 14.4 14.2 14.2  LYMPHSABS 0.9* 0.9*  --   MONOABS 0.3 0.3  --   EOSABS 0.2 0.2  --   BASOSABS 0.0 0.0  --     Chemistries   Recent Labs Lab 10/30/15 1338 11/02/15 1127 11/03/15 0444  NA 144 144 146*  K 4.4 3.8 3.4*  CL 107 108 114*  CO2 30 31 28   GLUCOSE 218* 226* 124*  BUN 20 22* 19  CREATININE 1.51* 1.40* 1.27*  CALCIUM 9.4 9.5 8.8*  AST 15 16 14*  ALT 15* 13* 12*  ALKPHOS 75 73 63  BILITOT 0.3 0.5 0.5   ------------------------------------------------------------------------------------------------------------------ estimated creatinine clearance is 53.3 mL/min (by C-G formula based on Cr of 1.27). ------------------------------------------------------------------------------------------------------------------ No results for input(s): HGBA1C in the last  72 hours. ------------------------------------------------------------------------------------------------------------------  Recent Labs  11/03/15 0444  CHOL 212*  HDL 52  LDLCALC 146*  TRIG 70  CHOLHDL 4.1   ------------------------------------------------------------------------------------------------------------------ No results for input(s): TSH, T4TOTAL, T3FREE, THYROIDAB in the last 72 hours.  Invalid input(s): FREET3 ------------------------------------------------------------------------------------------------------------------ No results for input(s): VITAMINB12, FOLATE, FERRITIN, TIBC, IRON, RETICCTPCT in the last 72 hours.  Coagulation profile  Recent Labs Lab 11/02/15 1635  INR 1.07    No results for input(s): DDIMER in the last 72 hours.  Cardiac Enzymes  Recent Labs Lab 11/02/15 1127 11/02/15 1351  TROPONINI <0.03 <0.03   ------------------------------------------------------------------------------------------------------------------ Invalid input(s): POCBNP    Assessment & Plan   #syncope probably from dehydration / bradycardia  Suspect due to dehydration Bradycardia seems to be a chronic problem. Echo normal Monitor heart overnight  PT evaluation  #Sinus bradycardia Avoid rate limiting drugs and monitor patient on telemetry. Appreciate cardiology input   #Acute kidney injury from dehydration on CKD Improved with IV hydration  # dementia supportive care  #History of seizure continue Keppra  #Upper lipidemia continue Lipitor        Code Status Orders        Start     Ordered   11/02/15 1651  Limited resuscitation (code)   Continuous    Question Answer Comment  In the event of cardiac or respiratory ARREST: Initiate Code Blue, Call Rapid Response Yes   In the event of cardiac or respiratory ARREST: Perform CPR Yes   In the event of cardiac or respiratory ARREST: Perform Intubation/Mechanical Ventilation Yes   In the  event of cardiac or respiratory ARREST: Use NIPPV/BiPAp only if indicated Yes   In the event of cardiac or respiratory ARREST: Administer ACLS medications if indicated Yes   In the event of cardiac or respiratory ARREST: Perform Defibrillation or Cardioversion if indicated No      11/02/15 1650    Code Status History    Date Active Date Inactive Code Status Order ID Comments User Context   11/23/2014  3:43 PM 11/24/2014  8:00 PM Full Code [id number]  Melton Alar, PA-C Inpatient    Advance Directive Documentation        Most Recent Value   Type of Advance Directive  Healthcare Power of Attorney   Pre-existing out of facility DNR order (yellow form or pink MOST form)     "MOST" Form in Place?             Consults  Cardiology  DVT Prophylaxis  due to history of hemorrhagic CVA Lovenox has been discontinued  Lab Results  Component Value Date   PLT 89* 11/03/2015     Time Spent in minutes   12min  Greater than 50% of time spent in care coordination and counseling patient regarding the condition and plan of care.   Dustin Flock M.D on 11/03/2015 at 12:40 PM  Between 7am to 6pm - Pager - [phone]  After 6pm go to [email or web address] - password [email or web address] [place]   Office  [phone]

## 2015-11-04 DIAGNOSIS — I214 Non-ST elevation (NSTEMI) myocardial infarction: Secondary | ICD-10-CM | POA: Diagnosis not present

## 2015-11-04 LAB — GLUCOSE, CAPILLARY
Glucose-Capillary: 134 mg/dL — ABNORMAL HIGH (ref 65–99)
Glucose-Capillary: 297 mg/dL — ABNORMAL HIGH (ref 65–99)
Glucose-Capillary: 254 mg/dL — ABNORMAL HIGH (ref 65–99)

## 2015-11-04 LAB — BASIC METABOLIC PANEL
ANION GAP: 4 — AB (ref 5–15)
BUN: 18 mg/dL (ref 6–20)
CHLORIDE: 113 mmol/L — AB (ref 101–111)
CO2: 28 mmol/L (ref 22–32)
Calcium: 9.1 mg/dL (ref 8.9–10.3)
Creatinine, Ser: 1.34 mg/dL — ABNORMAL HIGH (ref 0.61–1.24)
GFR calc Af Amer: 60 mL/min — ABNORMAL LOW (ref >60)
GFR, EST NON AFRICAN AMERICAN: 52 mL/min — AB (ref >60)
GLUCOSE: 141 mg/dL — AB (ref 65–99)
POTASSIUM: 3.5 mmol/L (ref 3.5–5.1)
Sodium: 145 mmol/L (ref 135–145)

## 2015-11-04 NOTE — Progress Notes (Signed)
[facility]  SUBJECTIVE: Complains today. Denies chest pain or syncope.   Filed Vitals:   11/03/15 1942 11/04/15 0512 11/04/15 0711 11/04/15 0800  BP: 162/53 176/89 162/63 160/68  Pulse: 60 70 48 70  Temp: 98.4 F (36.9 C) 98.6 F (37 C)  98.6 F (37 C)  TempSrc: Oral   Oral  Resp: 18 18  16   Height:      Weight:  82.419 kg (181 lb 11.2 oz)    SpO2: 96% 96%  96%    Intake/Output Summary (Last 24 hours) at 11/04/15 0949 Last data filed at 11/03/15 2234  Gross per 24 hour  Intake    240 ml  Output      0 ml  Net    240 ml    LABS: Basic Metabolic Panel:  Recent Labs  11/03/15 0444 11/04/15 0701  NA 146* 145  K 3.4* 3.5  CL 114* 113*  CO2 28 28  GLUCOSE 124* 141*  BUN 19 18  CREATININE 1.27* 1.34*  CALCIUM 8.8* 9.1   Liver Function Tests:  Recent Labs  11/02/15 1127 11/03/15 0444  AST 16 14*  ALT 13* 12*  ALKPHOS 73 63  BILITOT 0.5 0.5  PROT 6.8 6.0*  ALBUMIN 3.7 3.4*   No results for input(s): LIPASE, AMYLASE in the last 72 hours. CBC:  Recent Labs  11/02/15 1127 11/03/15 0444  WBC 4.0 5.0  NEUTROABS 2.5  --   HGB 10.9* 10.1*  HCT 32.6* 29.4*  MCV 80.5 79.5*  PLT 87* 89*   Cardiac Enzymes:  Recent Labs  11/02/15 1127 11/02/15 1351  TROPONINI <0.03 <0.03   BNP: Invalid input(s): POCBNP D-Dimer: No results for input(s): DDIMER in the last 72 hours. Hemoglobin A1C: No results for input(s): HGBA1C in the last 72 hours. Fasting Lipid Panel:  Recent Labs  11/03/15 0444  CHOL 212*  HDL 52  LDLCALC 146*  TRIG 70  CHOLHDL 4.1   Thyroid Function Tests: No results for input(s): TSH, T4TOTAL, T3FREE, THYROIDAB in the last 72 hours.  Invalid input(s): FREET3 Anemia Panel: No results for input(s): VITAMINB12, FOLATE, FERRITIN, TIBC, IRON, RETICCTPCT in the last 72 hours.   Physical Exam: Blood pressure 160/68, pulse 70, temperature 98.6 F (37 C), temperature source Oral, resp. rate 16,  height 5\' 9"  (1.753 m), weight 82.419 kg (181 lb 11.2 oz), SpO2 96 %.   Wt Readings from Last 1 Encounters:  11/04/15 82.419 kg (181 lb 11.2 oz)     General appearance: alert and cooperative Resp: clear to auscultation bilaterally Cardio: Sinus bradycardia GI: soft, non-tender; bowel sounds normal; no masses,  no organomegaly Neurologic: Mental status: alertness: Responds appropriately to questions, orientation: person, place  TELEMETRY: Reviewed telemetry pt in sinus bradycardia at a rate of 45-50:  ASSESSMENT AND PLAN:  Active Problems:   Syncope-etiology unclear. Likely related to bradycardia which is chronic and possible mild volume depletion. Neurologic he feels may be playing a role however based on neurology consult, is felt that this was not a seizure. We'll continue to follow. Consideration for permanent pacemaker could be raised however patient appears relatively stable with current heart rate which dates back at least several years.    Teodoro Spray., MD, [facility] 11/04/2015 9:49 AM

## 2015-11-04 NOTE — Consult Note (Signed)
Date: 11/04/2015                  Patient Name:  Jeffery Schneider  MRN: [id number]  DOB: 1945-11-06  Age / Sex: 70 y.o., male         PCP: [name], MD  [facility]               Service Requesting Consult: Internal Medicine                 Reason for Consult: ARF            History of Present Illness: Patient is a 70 y.o. 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   Patient is not able to provide any meaningful history. All information is from patient's chart and patient's wife who provides total care to the patient at home.  She states that her had excessive urination this past 2 weeks. He was seen at [facility] this past Monday. She reports that his urine and breath had fishy odor. Labs were checked and found to have high sodium and elevated creatinine. While driving back she received phone call and was instructed to take the patient to nearest ER for fluids as he was "dehydrated". She brought patient to [facility] where he was evaluated by ER. She states that patient did not receive IV fluids that time  2 days ago, patient woke up in sweats causing bed sheet to be wet. She gave him a shower. Blood sugar was 426. She gave him 8 units of insulin and breakfast. But then patient passed out and fell on the floor. She called EMS who found his BP to be 95/72. P 75. He was given oxygen and brought to the hospital.   Nephrology consult for ARF Baseline Cr appears to be 1.35/GFR >60 from 11/2014 Presenting creatinine 1.40/GFR 47 It has improved further with IV fluids to 1.34 to his baseline     Medications: Outpatient medications: Prescriptions prior to admission  Medication Sig  Dispense Refill Last Dose  . finasteride (PROSCAR) 5 MG tablet Take 5 mg by mouth daily.  11 11/02/2015 at 1000  . insulin aspart (NOVOLOG) 100 UNIT/ML injection Inject 6-7 Units into the skin 3 (three) times daily before meals. Sliding scale: 151-200=1 unit, 201-250=2 unit, 251-300=3 units, 301-350=4 units, 351-400=5 units, over 400 call dr   11/02/2015 at 1000  . insulin glargine (LANTUS) 100 UNIT/ML injection Inject 12 Units into the skin at bedtime.   11/01/2015 at Unknown time  . levETIRAcetam (KEPPRA) 1000 MG tablet Take 1,000 mg by mouth 2 (two) times daily.    11/02/2015 at 1000  . lubiprostone (AMITIZA) 24 MCG capsule Take 24 mcg by mouth daily with breakfast.    11/02/2015 at 1000  . Melatonin 2.5 MG CAPS Take 2.5 mg by mouth daily.    11/01/2015 at Unknown time  . pantoprazole (PROTONIX) 40 MG tablet Take 40 mg by mouth daily.   11/02/2015 at 1000  . sertraline (ZOLOFT) 100 MG tablet Take 50 mg by mouth at bedtime.    11/01/2015 at Unknown time  . terazosin (HYTRIN) 2 MG capsule Take 2 mg by mouth at bedtime.   11/01/2015 at Unknown time  . traZODone (DESYREL) 50  MG tablet Take 50 mg by mouth at bedtime as needed for sleep.   prn at prn    Current medications: Current Facility-Administered Medications  Medication Dose Route Frequency Provider Last Rate Last Dose  . acetaminophen (TYLENOL) tablet 650 mg  650 mg Oral Q6H PRN Nicholes Mango, MD       Or  . acetaminophen (TYLENOL) suppository 650 mg  650 mg Rectal Q6H PRN Nicholes Mango, MD      . atorvastatin (LIPITOR) tablet 40 mg  40 mg Oral q1800 Nicholes Mango, MD   40 mg at 11/03/15 1734  . docusate sodium (COLACE) capsule 100 mg  100 mg Oral BID Nicholes Mango, MD   100 mg at 11/04/15 1027  . finasteride (PROSCAR) tablet 5 mg  5 mg Oral Daily Nicholes Mango, MD   5 mg at 11/04/15 1027  . hydrALAZINE (APRESOLINE) injection 10 mg  10 mg Intravenous Q6H PRN Nicholes Mango, MD   10 mg at 11/02/15 1746  . insulin aspart (novoLOG) injection 0-9 Units  0-9 Units  Subcutaneous TID AC & HS Lance Coon, MD   1 Units at 11/04/15 0900  . insulin glargine (LANTUS) injection 12 Units  12 Units Subcutaneous QHS Nicholes Mango, MD   12 Units at 11/03/15 2225  . levETIRAcetam (KEPPRA) tablet 1,000 mg  1,000 mg Oral BID Nicholes Mango, MD   1,000 mg at 11/04/15 1027  . lubiprostone (AMITIZA) capsule 24 mcg  24 mcg Oral Q breakfast Nicholes Mango, MD   24 mcg at 11/04/15 1027  . Melatonin TABS 2.5 mg  2.5 mg Oral Daily Nicholes Mango, MD   2.5 mg at 11/04/15 1100  . ondansetron (ZOFRAN) tablet 4 mg  4 mg Oral Q6H PRN Nicholes Mango, MD       Or  . ondansetron (ZOFRAN) injection 4 mg  4 mg Intravenous Q6H PRN Aruna Gouru, MD      . pantoprazole (PROTONIX) EC tablet 40 mg  40 mg Oral Daily Nicholes Mango, MD   40 mg at 11/04/15 1027  . potassium chloride (K-DUR,KLOR-CON) CR tablet 10 mEq  10 mEq Oral Daily Teodoro Spray, MD   10 mEq at 11/04/15 1027  . sertraline (ZOLOFT) tablet 50 mg  50 mg Oral QHS Nicholes Mango, MD   50 mg at 11/03/15 2227  . sodium chloride flush (NS) 0.9 % injection 3 mL  3 mL Intravenous Q12H Nicholes Mango, MD   3 mL at 11/03/15 1000  . terazosin (HYTRIN) capsule 2 mg  2 mg Oral QHS Nicholes Mango, MD   2 mg at 11/03/15 2227  . traZODone (DESYREL) tablet 50 mg  50 mg Oral QHS PRN Nicholes Mango, MD          Allergies: Allergies  Allergen Reactions  . Ace Inhibitors   . Calcitonin   . Coffee Bean Extract [Coffea Arabica]     Per daughter  . Influenza Vaccines Nausea And Vomiting  . Lisinopril Other (See Comments)    Sees black spots  . Loratadine Itching  . Penicillins Other (See Comments)    Fainting.   . Sulfa Antibiotics Other (See Comments)    Fainting.   . Actos [Pioglitazone] Itching and Rash  . Codeine Rash  . Streptomycin Rash      Past Medical History: Past Medical History  Diagnosis Date  . Hypertension   . Diabetes mellitus without complication ([place])   . Brain tumor ([place])   . Stroke ([place])   . Kidney disease   .  Hyperlipidemia   .  Bradycardia   . Syncope   . Abnormal EKG   . Dementia   . GERD (gastroesophageal reflux disease)   . Dysphagia   . Diabetes ([place])   . Constipation   . Seizures ([place])   . Sickle cell trait ([place])   . Benign prostatic hyperplasia   . Chronic kidney disease      Past Surgical History: Past Surgical History  Procedure Laterality Date  . Cardiac catheterization    . Craniotomy for tumor      frontal lobe     Family History: Family History  Problem Relation Age of Onset  . Heart attack Mother   . Heart attack Father   . Thyroid cancer Sister   . Diabetes Sister   . Heart attack Brother      Social History: Social History   Social History  . Marital Status: Married    Spouse Name: N/A  . Number of Children: N/A  . Years of Education: N/A   Occupational History  . Not on file.   Social History Main Topics  . Smoking status: Never Smoker   . Smokeless tobacco: Not on file  . Alcohol Use: No  . Drug Use: No  . Sexual Activity: Not on file   Other Topics Concern  . Not on file   Social History Narrative     Review of Systems: Patient has dementia and is not able to provide any meaningful history Gen:  HEENT:  CV:  Resp:  GI: GU :  MS:  Derm:   Psych: Heme:  Neuro:  Endocrine  Vital Signs: Blood pressure 180/61, pulse 51, temperature 97.6 F (36.4 C), temperature source Oral, resp. rate 16, height 5\' 9"  (1.753 m), weight 82.419 kg (181 lb 11.2 oz), SpO2 100 %.   Intake/Output Summary (Last 24 hours) at 11/04/15 1709 Last data filed at 11/04/15 1335  Gross per 24 hour  Intake    360 ml  Output      0 ml  Net    360 ml    Weight trends: Filed Weights   11/02/15 1736 11/03/15 0543 11/04/15 0512  Weight: 82.827 kg (182 lb 9.6 oz) 82.76 kg (182 lb 7.2 oz) 82.419 kg (181 lb 11.2 oz)    Physical Exam: General:  Laying in bed  HEENT Anicteric, moist mucus membranes  Neck: supple  Lungs: Clear b/l  Heart:: Regular, no rub  Abdomen: Soft,  NT, non distended  Extremities: No peripheral edema  Neurologic: Alert, oriented to self and family  Skin: No acute rashes             Lab results: Basic Metabolic Panel:  Recent Labs Lab 11/02/15 1127 11/03/15 0444 11/04/15 0701  NA 144 146* 145  K 3.8 3.4* 3.5  CL 108 114* 113*  CO2 31 28 28   GLUCOSE 226* 124* 141*  BUN 22* 19 18  CREATININE 1.40* 1.27* 1.34*  CALCIUM 9.5 8.8* 9.1    Liver Function Tests:  Recent Labs Lab 11/03/15 0444  AST 14*  ALT 12*  ALKPHOS 63  BILITOT 0.5  PROT 6.0*  ALBUMIN 3.4*   No results for input(s): LIPASE, AMYLASE in the last 168 hours. No results for input(s): AMMONIA in the last 168 hours.  CBC:  Recent Labs Lab 10/30/15 1338 11/02/15 1127 11/03/15 0444  WBC 4.4 4.0 5.0  NEUTROABS 2.9 2.5  --   HGB 11.1* 10.9* 10.1*  HCT 33.3* 32.6* 29.4*  MCV 80.4  80.5 79.5*  PLT 92* 87* 89*    Cardiac Enzymes:  Recent Labs Lab 11/02/15 1351  TROPONINI <0.03    BNP: Invalid input(s): POCBNP  CBG:  Recent Labs Lab 11/03/15 1109 11/03/15 1702 11/03/15 2050 11/04/15 0738 11/04/15 1623  GLUCAP 124* 274* 224* 134* 297*    Microbiology: No results found for this or any previous visit (from the past 720 hour(s)).   Coagulation Studies:  Recent Labs  11/02/15 1635  LABPROT 14.1  INR 1.07    Urinalysis: No results for input(s): COLORURINE, LABSPEC, PHURINE, GLUCOSEU, HGBUR, BILIRUBINUR, KETONESUR, PROTEINUR, UROBILINOGEN, NITRITE, LEUKOCYTESUR in the last 72 hours.  Invalid input(s): APPERANCEUR      Imaging:  No results found.   Assessment & Plan: Pt is a 70 y.o. 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   1. ARF on CKD st 3 with proteinuria Patient appears to have baseline Cr of 1.34/GFR 60 from 2016 He presented with elevated creatinine. He was hypotensive at home prior to arrival which may have caused AKI or pre-renal ARF His S Creatinine has improved back to baseline Urinalysis shows >1000 glucose, + proteinuria 0-5 RBC 0-5 WBC Recommend baseline renal U/S  2. 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 All questions were answered to family's satisfaction

## 2015-11-04 NOTE — Progress Notes (Signed)
[place] at [facility]                                                                                                                                                                                            Patient Demographics   Jeffery Schneider, is a 70 y.o. male, DOB - 1946-05-03, [id number]  Admit date - 11/02/2015   Admitting Physician Nicholes Mango, MD  Outpatient Primary MD for the patient is [name], MD   LOS -   Subjective: Patient admitted with syncope.  Patient does have dementia but able to answer  Some  of the questions appropriately. He is oriented to time place person today and says he had a very good night. Denies any complaints.     Review of Systems:   CONSTITUTIONAL: Due to dementia and unable to provide review of systems  Vitals:   Filed Vitals:   11/03/15 1106 11/03/15 1942 11/04/15 0512 11/04/15 0711  BP: 164/58 162/53 176/89 162/63  Pulse: 55 60 70 48  Temp: 98 F (36.7 C) 98.4 F (36.9 C) 98.6 F (37 C)   TempSrc: Oral Oral    Resp: 20 18 18    Height:      Weight:   82.419 kg (181 lb 11.2 oz)   SpO2: 98% 96% 96%     Wt Readings from Last 3 Encounters:  11/04/15 82.419 kg (181 lb 11.2 oz)  10/30/15 85.276 kg (188 lb)  09/16/15 83.915 kg (185 lb)     Intake/Output Summary (Last 24 hours) at 11/04/15 0733 Last data filed at 11/03/15 2234  Gross per 24 hour  Intake    240 ml  Output      0 ml  Net    240 ml    Physical Exam:   GENERAL: Pleasant-appearing in no apparent distress.  HEAD, EYES, EARS, NOSE AND THROAT: Atraumatic, normocephalic. Extraocular muscles are intact. Pupils equal and reactive to light. Sclerae anicteric. No conjunctival injection. No oro-pharyngeal erythema.  NECK: Supple. There is no jugular venous distention. No bruits, no lymphadenopathy, no thyromegaly.  HEART: Regular rate and rhythm,. No murmurs, no rubs, no clicks.  LUNGS: Clear to auscultation bilaterally. No  rales or rhonchi. No wheezes.  ABDOMEN: Soft, flat, nontender, nondistended. Has good bowel sounds. No hepatosplenomegaly appreciated.  EXTREMITIES: No evidence of any cyanosis, clubbing, or peripheral edema.  +2 pedal and radial pulses bilaterally.  NEUROLOGIC: The patient is alert not oriented  SKIN: Moist and warm with no rashes appreciated.  Psych: Not anxious, depressed LN: No inguinal LN enlargement    Antibiotics   Anti-infectives  None      Medications   Scheduled Meds: . atorvastatin  40 mg Oral q1800  . docusate sodium  100 mg Oral BID  . finasteride  5 mg Oral Daily  . insulin aspart  0-9 Units Subcutaneous TID AC & HS  . insulin glargine  12 Units Subcutaneous QHS  . levETIRAcetam  1,000 mg Oral BID  . lubiprostone  24 mcg Oral Q breakfast  . Melatonin  2.5 mg Oral Daily  . pantoprazole  40 mg Oral Daily  . potassium chloride  10 mEq Oral Daily  . sertraline  50 mg Oral QHS  . sodium chloride flush  3 mL Intravenous Q12H  . terazosin  2 mg Oral QHS   Continuous Infusions:  PRN Meds:.acetaminophen **OR** acetaminophen, hydrALAZINE, ondansetron **OR** ondansetron (ZOFRAN) IV, traZODone   Data Review:   Micro Results No results found for this or any previous visit (from the past 240 hour(s)).  Radiology Reports Dg Chest 2 View  11/02/2015  CLINICAL DATA:  Seizure. EXAM: CHEST  2 VIEW COMPARISON:  September 16, 2015. FINDINGS: Stable cardiomediastinal silhouette. Atherosclerosis of thoracic aorta is noted. No pneumothorax or pleural effusion is noted. Both lungs are clear. The visualized skeletal structures are unremarkable. IMPRESSION: No active cardiopulmonary disease. Electronically Signed   By: Marijo Conception, M.D.   On: 11/02/2015 12:23   Ct Head Wo Contrast  11/02/2015  CLINICAL DATA:  Seizure today, history of seizures, syncope EXAM: CT HEAD WITHOUT CONTRAST TECHNIQUE: Contiguous axial images were obtained from the base of the skull through the vertex  without intravenous contrast. COMPARISON:  09/17/2015 FINDINGS: Brain: No intracranial hemorrhage, mass effect or midline shift. Stable encephalomalacia from prior tumor resection in left frontal lobe. Ex vacuo dilatation of left frontal horn again noted. Ventricular size is stable from prior exam. No definite recurrent mass is noted on this unenhanced scan. Stable atrophy and chronic white matter disease. No definite acute cortical infarction. No intraventricular hemorrhage. Vascular: Extensive atherosclerotic calcifications of carotid siphon again noted. Skull: Again noted status post left frontal craniotomy. No skull fracture is noted. Sinuses/Orbits: The mastoid air cells and visualized sphenoid sinuses are unremarkable. Other: None IMPRESSION: No acute intracranial abnormality. Stable atrophy and chronic white matter disease. No definite acute cortical infarction. Stable postsurgical changes left frontal lobe. No definite recurrent mass is noted on this unenhanced scan. Electronically Signed   By: Lahoma Crocker M.D.   On: 11/02/2015 12:37     CBC  Recent Labs Lab 10/30/15 1338 11/02/15 1127 11/03/15 0444  WBC 4.4 4.0 5.0  HGB 11.1* 10.9* 10.1*  HCT 33.3* 32.6* 29.4*  PLT 92* 87* 89*  MCV 80.4 80.5 79.5*  MCH 26.8 26.9 27.4  MCHC 33.3 33.4 34.4  RDW 14.4 14.2 14.2  LYMPHSABS 0.9* 0.9*  --   MONOABS 0.3 0.3  --   EOSABS 0.2 0.2  --   BASOSABS 0.0 0.0  --     Chemistries   Recent Labs Lab 10/30/15 1338 11/02/15 1127 11/03/15 0444  NA 144 144 146*  K 4.4 3.8 3.4*  CL 107 108 114*  CO2 30 31 28   GLUCOSE 218* 226* 124*  BUN 20 22* 19  CREATININE 1.51* 1.40* 1.27*  CALCIUM 9.4 9.5 8.8*  AST 15 16 14*  ALT 15* 13* 12*  ALKPHOS 75 73 63  BILITOT 0.3 0.5 0.5   ------------------------------------------------------------------------------------------------------------------ estimated creatinine clearance is 53.3 mL/min (by C-G formula based on Cr of  1.27). ------------------------------------------------------------------------------------------------------------------ No results  for input(s): HGBA1C in the last 72 hours. ------------------------------------------------------------------------------------------------------------------  Recent Labs  11/03/15 0444  CHOL 212*  HDL 52  LDLCALC 146*  TRIG 70  CHOLHDL 4.1   ------------------------------------------------------------------------------------------------------------------ No results for input(s): TSH, T4TOTAL, T3FREE, THYROIDAB in the last 72 hours.  Invalid input(s): FREET3 ------------------------------------------------------------------------------------------------------------------ No results for input(s): VITAMINB12, FOLATE, FERRITIN, TIBC, IRON, RETICCTPCT in the last 72 hours.  Coagulation profile  Recent Labs Lab 11/02/15 1635  INR 1.07    No results for input(s): DDIMER in the last 72 hours.  Cardiac Enzymes  Recent Labs Lab 11/02/15 1127 11/02/15 1351  TROPONINI <0.03 <0.03   ------------------------------------------------------------------------------------------------------------------ Invalid input(s): POCBNP    Assessment & Plan   #syncope probably from dehydration / bradycardia  Suspect due to dehydration Bradycardia seems to be a chronic problem;avoid av nodal blocking agents,., Reviewed the note from cardiology notes from  [facility], according to them patient has a history of tachybradycardia syndrome, but never had a syncope.  They recommend evaluation for  Pacemaker. If pt has symptoms. Cardiology is following. Heart rate as low as 39. But no pauses. Has sinus bradycardia with first-degree heart block. Patient was seen by cardiology the year before and the was the referred for pacemaker but the wife wanted second opinion so patient was taken to [facility] cardiology, seen by Dr. Cheri Fowler on February . 20th 2017 Echo normal  # # dementia  supportive care  #History of seizure continue Keppra/history of meningioma, status post resection, history of hemorrhagic stroke: Aspirin, Lovenox discontinued due to his history. seen by neurology, no further neurological workup is recommended.  #hyperlipidemia;statins.  Acute renal failure on chronic renal failure: Improved with IV hydration. Mild hypokalemia continue replacement.   Mild hypernatremia;  Monitor closely.  Code Status Orders        Start     Ordered   11/02/15 1651  Limited resuscitation (code)   Continuous    Question Answer Comment  In the event of cardiac or respiratory ARREST: Initiate Code Blue, Call Rapid Response Yes   In the event of cardiac or respiratory ARREST: Perform CPR Yes   In the event of cardiac or respiratory ARREST: Perform Intubation/Mechanical Ventilation Yes   In the event of cardiac or respiratory ARREST: Use NIPPV/BiPAp only if indicated Yes   In the event of cardiac or respiratory ARREST: Administer ACLS medications if indicated Yes   In the event of cardiac or respiratory ARREST: Perform Defibrillation or Cardioversion if indicated No      11/02/15 1650    Code Status History    Date Active Date Inactive Code Status Order ID Comments User Context   11/23/2014  3:43 PM 11/24/2014  8:00 PM Full Code [id number]  Melton Alar, PA-C Inpatient    Advance Directive Documentation        Most Recent Value   Type of Advance Directive  Healthcare Power of [place]   Pre-existing out of facility DNR order (yellow form or pink MOST form)     "MOST" Form in Place?             Consults  Cardiology  DVT Prophylaxis  due to history of hemorrhagic CVA Lovenox has been discontinued  Lab Results  Component Value Date   PLT 89* 11/03/2015     Time Spent in minutes   64min  Greater than 50% of time spent in care coordination and counseling patient regarding the condition and plan of care.   Epifanio Lesches M.D on 11/04/2015 at 7:32  AM  Between 7am to 6pm - Pager - [phone]  After 6pm go to [email or web address] - password [email or web address] [place] [facility]   Office  [phone]

## 2015-11-05 ENCOUNTER — Observation Stay: Payer: Medicare HMO

## 2015-11-05 DIAGNOSIS — I214 Non-ST elevation (NSTEMI) myocardial infarction: Secondary | ICD-10-CM | POA: Diagnosis not present

## 2015-11-05 LAB — GLUCOSE, CAPILLARY
GLUCOSE-CAPILLARY: 75 mg/dL (ref 65–99)
GLUCOSE-CAPILLARY: 117 mg/dL — AB (ref 65–99)
Glucose-Capillary: 382 mg/dL — ABNORMAL HIGH (ref 65–99)
Glucose-Capillary: 392 mg/dL — ABNORMAL HIGH (ref 65–99)

## 2015-11-05 LAB — HEMOGLOBIN A1C: HEMOGLOBIN A1C: 8.9 % — AB (ref 4.0–6.0)

## 2015-11-05 MED ORDER — KETOROLAC TROMETHAMINE 0.5 % OP SOLN
1.0000 [drp] | Freq: Four times a day (QID) | OPHTHALMIC | Status: DC
  Administered 2015-11-05 – 2015-11-07 (×8): 1 [drp] via OPHTHALMIC
  Filled 2015-11-05: qty 3

## 2015-11-05 MED ORDER — NYSTATIN 100000 UNIT/GM EX POWD
Freq: Two times a day (BID) | CUTANEOUS | Status: DC
  Administered 2015-11-05 – 2015-11-07 (×5): via TOPICAL
  Filled 2015-11-05: qty 15

## 2015-11-05 MED ORDER — INSULIN GLARGINE 100 UNIT/ML ~~LOC~~ SOLN
13.0000 [IU] | Freq: Every day | SUBCUTANEOUS | Status: DC
  Administered 2015-11-05 – 2015-11-06 (×2): 13 [IU] via SUBCUTANEOUS
  Filled 2015-11-05 (×3): qty 0.13

## 2015-11-05 MED ORDER — PREDNISOLONE ACETATE 1 % OP SUSP
1.0000 [drp] | Freq: Four times a day (QID) | OPHTHALMIC | Status: DC
  Administered 2015-11-05 – 2015-11-07 (×8): 1 [drp] via OPHTHALMIC
  Filled 2015-11-05: qty 1

## 2015-11-05 NOTE — Plan of Care (Signed)
Problem: Acute Rehab PT Goals(only PT should resolve) Goal: Pt Will Transfer Bed To Chair/Chair To Bed Pt will transfer sit to/from-stand with RW and supervision without loss-of-balance to demonstrate good stability and safety awareness for independent mobility in home.     Goal: Pt Will Ambulate Pt will ambulate with RW and Supervision using a step-through pattern and equal step length for a distance greater than 335ft at >0.47m/s to demonstrate the ability to perform safe household distance ambulation at discharge.    Goal: Pt Will Go Up/Down Stairs Pt will ascend/descend 15 stairs with LRAD, minGuard, and at least 1 handrail to demonstrate safe access of living spaces of home.

## 2015-11-05 NOTE — Progress Notes (Signed)
[place] at [facility]                                                                                                                                                                                            Patient Demographics   Jeffery Schneider, is a 70 y.o. male, DOB - [birth year], [id number]  Admit date - 11/02/2015   Admitting Physician Nicholes Mango, MD  Outpatient Primary MD for the patient is [name], MD   LOS -   Subjective: Patient admitted with syncope.  Patient does have dementia but able to answer  Some  of the questions appropriately.  Spoke with wife at length. Once physical therapy and also requesting help with the brake fast , lunch and dinner.   Review of Systems:   CONSTITUTIONAL: Due to dementia and unable to provide review of systems  Vitals:   Filed Vitals:   11/04/15 2010 11/05/15 0540 11/05/15 0720 11/05/15 0720  BP: 169/63 165/64    Pulse: 59 73 73 69  Temp: 98.8 F (37.1 C) 97.9 F (36.6 C)    TempSrc: Oral Oral    Resp: 18 18    Height:      Weight:  82.01 kg (180 lb 12.8 oz)    SpO2: 95% 94%  99%    Wt Readings from Last 3 Encounters:  11/05/15 82.01 kg (180 lb 12.8 oz)  10/30/15 85.276 kg (188 lb)  09/16/15 83.915 kg (185 lb)     Intake/Output Summary (Last 24 hours) at 11/05/15 1127 Last data filed at 11/05/15 1022  Gross per 24 hour  Intake    600 ml  Output      0 ml  Net    600 ml    Physical Exam:   GENERAL: Pleasant-appearing in no apparent distress.  HEAD, EYES, EARS, NOSE AND THROAT: Atraumatic, normocephalic. Extraocular muscles are intact. Pupils equal and reactive to light. Sclerae anicteric. No conjunctival injection. No oro-pharyngeal erythema.  NECK: Supple. There is no jugular venous distention. No bruits, no lymphadenopathy, no thyromegaly.  HEART: Regular rate and rhythm,. No murmurs, no rubs, no clicks.  LUNGS: Clear to auscultation bilaterally. No rales or rhonchi.  No wheezes.  ABDOMEN: Soft, flat, nontender, nondistended. Has good bowel sounds. No hepatosplenomegaly appreciated.  EXTREMITIES: No evidence of any cyanosis, clubbing, or peripheral edema.  +2 pedal and radial pulses bilaterally.  NEUROLOGIC: The patient is alert , has some memory issues secondary to history of meningioma and hemorrhagic stroke.  SKIN: Moist and warm with no rashes appreciated.  Psych: Not anxious, depressed LN: No inguinal LN enlargement  Antibiotics   Anti-infectives    None      Medications   Scheduled Meds: . atorvastatin  40 mg Oral q1800  . docusate sodium  100 mg Oral BID  . finasteride  5 mg Oral Daily  . insulin aspart  0-9 Units Subcutaneous TID AC & HS  . insulin glargine  13 Units Subcutaneous QHS  . levETIRAcetam  1,000 mg Oral BID  . lubiprostone  24 mcg Oral Q breakfast  . pantoprazole  40 mg Oral Daily  . potassium chloride  10 mEq Oral Daily  . sertraline  50 mg Oral QHS  . sodium chloride flush  3 mL Intravenous Q12H  . terazosin  2 mg Oral QHS   Continuous Infusions:  PRN Meds:.acetaminophen **OR** acetaminophen, hydrALAZINE, ondansetron **OR** ondansetron (ZOFRAN) IV, traZODone   Data Review:   Micro Results No results found for this or any previous visit (from the past 240 hour(s)).  Radiology Reports Dg Chest 2 View  11/02/2015  CLINICAL DATA:  Seizure. EXAM: CHEST  2 VIEW COMPARISON:  September 16, 2015. FINDINGS: Stable cardiomediastinal silhouette. Atherosclerosis of thoracic aorta is noted. No pneumothorax or pleural effusion is noted. Both lungs are clear. The visualized skeletal structures are unremarkable. IMPRESSION: No active cardiopulmonary disease. Electronically Signed   By: Marijo Conception, M.D.   On: 11/02/2015 12:23   Ct Head Wo Contrast  11/02/2015  CLINICAL DATA:  Seizure today, history of seizures, syncope EXAM: CT HEAD WITHOUT CONTRAST TECHNIQUE: Contiguous axial images were obtained from the base of the skull  through the vertex without intravenous contrast. COMPARISON:  09/17/2015 FINDINGS: Brain: No intracranial hemorrhage, mass effect or midline shift. Stable encephalomalacia from prior tumor resection in left frontal lobe. Ex vacuo dilatation of left frontal horn again noted. Ventricular size is stable from prior exam. No definite recurrent mass is noted on this unenhanced scan. Stable atrophy and chronic white matter disease. No definite acute cortical infarction. No intraventricular hemorrhage. Vascular: Extensive atherosclerotic calcifications of carotid siphon again noted. Skull: Again noted status post left frontal craniotomy. No skull fracture is noted. Sinuses/Orbits: The mastoid air cells and visualized sphenoid sinuses are unremarkable. Other: None IMPRESSION: No acute intracranial abnormality. Stable atrophy and chronic white matter disease. No definite acute cortical infarction. Stable postsurgical changes left frontal lobe. No definite recurrent mass is noted on this unenhanced scan. Electronically Signed   By: Lahoma Crocker M.D.   On: 11/02/2015 12:37     CBC  Recent Labs Lab 10/30/15 1338 11/02/15 1127 11/03/15 0444  WBC 4.4 4.0 5.0  HGB 11.1* 10.9* 10.1*  HCT 33.3* 32.6* 29.4*  PLT 92* 87* 89*  MCV 80.4 80.5 79.5*  MCH 26.8 26.9 27.4  MCHC 33.3 33.4 34.4  RDW 14.4 14.2 14.2  LYMPHSABS 0.9* 0.9*  --   MONOABS 0.3 0.3  --   EOSABS 0.2 0.2  --   BASOSABS 0.0 0.0  --     Chemistries   Recent Labs Lab 10/30/15 1338 11/02/15 1127 11/03/15 0444 11/04/15 0701  NA 144 144 146* 145  K 4.4 3.8 3.4* 3.5  CL 107 108 114* 113*  CO2 30 31 28 28   GLUCOSE 218* 226* 124* 141*  BUN 20 22* 19 18  CREATININE 1.51* 1.40* 1.27* 1.34*  CALCIUM 9.4 9.5 8.8* 9.1  AST 15 16 14*  --   ALT 15* 13* 12*  --   ALKPHOS 75 73 63  --   BILITOT 0.3 0.5 0.5  --    ------------------------------------------------------------------------------------------------------------------  estimated creatinine  clearance is 50.6 mL/min (by C-G formula based on Cr of 1.34). ------------------------------------------------------------------------------------------------------------------ No results for input(s): HGBA1C in the last 72 hours. ------------------------------------------------------------------------------------------------------------------  Recent Labs  11/03/15 0444  CHOL 212*  HDL 52  LDLCALC 146*  TRIG 70  CHOLHDL 4.1   ------------------------------------------------------------------------------------------------------------------ No results for input(s): TSH, T4TOTAL, T3FREE, THYROIDAB in the last 72 hours.  Invalid input(s): FREET3 ------------------------------------------------------------------------------------------------------------------ No results for input(s): VITAMINB12, FOLATE, FERRITIN, TIBC, IRON, RETICCTPCT in the last 72 hours.  Coagulation profile  Recent Labs Lab 11/02/15 1635  INR 1.07    No results for input(s): DDIMER in the last 72 hours.  Cardiac Enzymes  Recent Labs Lab 11/02/15 1127 11/02/15 1351  TROPONINI <0.03 <0.03   ------------------------------------------------------------------------------------------------------------------ Invalid input(s): POCBNP    Assessment & Plan   #syncope probably from dehydration / bradycardia  Suspect due to dehydration; Seen by neurology  And cardiology. No further workup was recommended by both of them.  Bradycardia seems to be a chronic problem;avoid av nodal blocking agents,., Reviewed the note from cardiology notes from  [facility], according to them patient has a history of tachybradycardia syndrome, but never had a syncope.  They recommend evaluation for  Pacemaker. If pt has symptoms. Cardiology is following. Rate maintained about 70. But no pauses. Has sinus bradycardia with first-degree heart block. Patient was seen by cardiology the year before and the was the referred for pacemaker but the  wife wanted second opinion so patient was taken to [facility] cardiology, seen by Dr. Cheri Fowler on February . 20th 2017 Echo normal Allergies following. # # dementia supportive care,  #History of seizure continue Keppra/history of meningioma, status post resection, history of hemorrhagic stroke: Aspirin, Lovenox discontinued due to his history. seen by neurology, no further neurological workup is recommended.  #hyperlipidemia;statins.  Acute renal failure on chronic renal failure: Improved with IV hydration.Renal function at baseline. Seen by nephrology. Ordered a renal ultrasound. *d/w with wife at length spent about 20 minutes.           Start     Ordered   11/02/15 1651  Limited resuscitation (code)   Continuous    Question Answer Comment  In the event of cardiac or respiratory ARREST: Initiate Code Blue, Call Rapid Response Yes   In the event of cardiac or respiratory ARREST: Perform CPR Yes   In the event of cardiac or respiratory ARREST: Perform Intubation/Mechanical Ventilation Yes   In the event of cardiac or respiratory ARREST: Use NIPPV/BiPAp only if indicated Yes   In the event of cardiac or respiratory ARREST: Administer ACLS medications if indicated Yes   In the event of cardiac or respiratory ARREST: Perform Defibrillation or Cardioversion if indicated No      11/02/15 1650    Code Status History    Date Active Date Inactive Code Status Order ID Comments User Context   11/23/2014  3:43 PM 11/24/2014  8:00 PM Full Code [id number]  Melton Alar, PA-C Inpatient    Advance Directive Documentation        Most Recent Value   Type of Advance Directive  Healthcare Power of Attorney   Pre-existing out of facility DNR order (yellow form or pink MOST form)     "MOST" Form in Place?             Consults  Cardiology  DVT Prophylaxis  due to history of hemorrhagic CVA Lovenox has been discontinued  Lab Results  Component Value Date   PLT 89*  11/03/2015     Time Spent  in minutes   25 min  Greater than 50% of time spent in care coordination and counseling patient regarding the condition and plan of care.   Epifanio Lesches M.D on 11/05/2015 at 11:27 AM  Between 7am to 6pm - Pager - [phone]  After 6pm go to [email or web address] - password [email or web address] [place] [facility]   Office  [phone]

## 2015-11-05 NOTE — Evaluation (Signed)
Physical Therapy Evaluation Patient Details Name: Jeffery Schneider MRN: [id number] DOB: 24-Mar-1946 Today's Date: 11/05/2015   History of Present Illness  Jeffery Schneider is a 70yo black male who comes to [facility] after syncope at home. 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 Pt found to be dehydrated. Neuro was consulted who does not feel that this episode was epileptic in nature.   Clinical Impression  Pt demonstrating moderate impairment of balance, strength, gross/fine motor control, and cognition, but is likely only partially off baseline. The patient reports that his gait feels slower, but denies it feeling weaker or less steady. Self-reported function and current demonstration of mobility are mildly discrepant. Determination of his cognition is difficult due to some delayed processing, response time, and word finding difficulty, but after extensive questioning and interaction, I have no reason to believe him to be anything less than Ox2. His gait impairments are heavy, but consistent, with quality not easily subject to fatigue. The above impairments are limiting to indep in ADL and functional mobility within the home and community. Pt will benefit from skilled PT intervention to address the above deficits in order to restore patient to PLOF and improve safety for return to home.        Follow Up Recommendations Home health PT    Equipment Recommendations  None recommended by PT    Recommendations for Other Services       Precautions / Restrictions Restrictions Weight Bearing Restrictions: No      Mobility  Bed Mobility Overal bed mobility: Modified Independent                Transfers Overall transfer level: Needs assistance Equipment used: Rolling walker (2 wheeled) Transfers: Sit to/from Stand Sit to Stand: Min guard            Ambulation/Gait Ambulation/Gait assistance: Min guard Ambulation Distance (Feet): 180  Feet Assistive device: Rolling walker (2 wheeled) Gait Pattern/deviations: Decreased step length - right;Decreased step length - left Gait velocity: 0.4m/s (maximal velocity) Gait velocity interpretation: <1.8 ft/sec, indicative of risk for recurrent falls General Gait Details: Elevated scapulae, heavy dependence on RW, slow and distracted.   Stairs            Wheelchair Mobility    Modified Rankin (Stroke Patients Only)       Balance Overall balance assessment: Needs assistance   Sitting balance-Leahy Scale: Good       Standing balance-Leahy Scale: Fair Standing balance comment: some LOB inititally after standing, but appears mildly more stable after gait trial.                              Pertinent Vitals/Pain Pain Assessment: No/denies pain    Home Living Family/patient expects to be discharged to:: Private residence Living Arrangements: Spouse/significant other Available Help at Discharge: Family Type of Home: House Home Access: Stairs to enter Entrance Stairs-Rails: None Technical brewer of Steps: 4 Home Layout: Two level Home Equipment: Environmental consultant - 2 wheels      Prior Function Level of Independence: Independent with assistive device(s)         Comments: Pt reports he uses a RW inside and in community, able to tolerate short community distances; reports indep in ADL (wearing an incontinence brief brought from home), and that he still drives which is difficult to believe granted his delayed processing and seizures disorder (no family available to comment)  Hand Dominance        Extremity/Trunk Assessment   Upper Extremity Assessment: Overall WFL for tasks assessed           Lower Extremity Assessment: Overall WFL for tasks assessed      Cervical / Trunk Assessment: Kyphotic  Communication   Communication:  (has some word finding difficulty but denies any word finding difficulty when asked; denies memory problems. )   Cognition Arousal/Alertness: Awake/alert Behavior During Therapy:  (Distractible, pausing to look at pictures on the wall in hallway. ) Overall Cognitive Status: No family/caregiver present to determine baseline cognitive functioning (I suspect his delayed processing, mild anomia, and distractibility, are all chronic problems given his extensive history of brain injuries (CVA, meningioma). )       Memory:  (appears intact, but no PLOF available. )              General Comments      Exercises        Assessment/Plan    PT Assessment Patient needs continued PT services  PT Diagnosis Altered mental status;Abnormality of gait;Generalized weakness   PT Problem List Decreased strength;Decreased range of motion;Decreased balance;Decreased mobility;Decreased safety awareness  PT Treatment Interventions Gait training;Stair training;Functional mobility training;Therapeutic activities;Therapeutic exercise;Balance training;Cognitive remediation;Patient/family education   PT Goals (Current goals can be found in the Care Plan section) Acute Rehab PT Goals PT Goal Formulation: Patient unable to participate in goal setting    Frequency Min 2X/week   Barriers to discharge        Co-evaluation               End of Session Equipment Utilized During Treatment: Gait belt Activity Tolerance: Patient tolerated treatment well Patient left: in bed;with nursing/sitter in room Nurse Communication: Mobility status;Other (comment)    Functional Limitation: Mobility: Walking and moving around Mobility: Walking and Moving Around Current Status [phone]): At least 60 percent but less than 80 percent impaired, limited or restricted Mobility: Walking and Moving Around Goal Status [phone]): At least 60 percent but less than 80 percent impaired, limited or restricted    Time: 1146-1203 PT Time Calculation (min) (ACUTE ONLY): 17 min   Charges:   PT Evaluation $PT Eval High Complexity: 1  Procedure PT Treatments $Therapeutic Activity: 8-22 mins   PT G Codes:   PT G-Codes **NOT FOR INPATIENT CLASS** Functional Limitation: Mobility: Walking and moving around Mobility: Walking and Moving Around Current Status [id number]): At least 60 percent but less than 80 percent impaired, limited or restricted Mobility: Walking and Moving Around Goal Status [phone]): At least 60 percent but less than 80 percent impaired, limited or restricted    1:02 PM, 11/05/2015 Etta Grandchild, PT, DPT PRN Physical Therapist - [place] License # [id number] [phone] (mobile)

## 2015-11-05 NOTE — Progress Notes (Signed)
[place]  SUBJECTIVE: No complaints this am. Somewhat difficult historian. Remains bradycardic with no prolonged pauses or syncopal episodes.    Filed Vitals:   11/04/15 2010 11/05/15 0540 11/05/15 0720 11/05/15 0720  BP: 169/63 165/64    Pulse: 59 73 73 69  Temp: 98.8 F (37.1 C) 97.9 F (36.6 C)    TempSrc: Oral Oral    Resp: 18 18    Height:      Weight:  82.01 kg (180 lb 12.8 oz)    SpO2: 95% 94%  99%    Intake/Output Summary (Last 24 hours) at 11/05/15 1143 Last data filed at 11/05/15 1022  Gross per 24 hour  Intake    600 ml  Output      0 ml  Net    600 ml    LABS: Basic Metabolic Panel:  Recent Labs  11/03/15 0444 11/04/15 0701  NA 146* 145  K 3.4* 3.5  CL 114* 113*  CO2 28 28  GLUCOSE 124* 141*  BUN 19 18  CREATININE 1.27* 1.34*  CALCIUM 8.8* 9.1   Liver Function Tests:  Recent Labs  11/03/15 0444  AST 14*  ALT 12*  ALKPHOS 63  BILITOT 0.5  PROT 6.0*  ALBUMIN 3.4*   No results for input(s): LIPASE, AMYLASE in the last 72 hours. CBC:  Recent Labs  11/03/15 0444  WBC 5.0  HGB 10.1*  HCT 29.4*  MCV 79.5*  PLT 89*   Cardiac Enzymes:  Recent Labs  11/02/15 1351  TROPONINI <0.03   BNP: Invalid input(s): POCBNP D-Dimer: No results for input(s): DDIMER in the last 72 hours. Hemoglobin A1C: No results for input(s): HGBA1C in the last 72 hours. Fasting Lipid Panel:  Recent Labs  11/03/15 0444  CHOL 212*  HDL 52  LDLCALC 146*  TRIG 70  CHOLHDL 4.1   Thyroid Function Tests: No results for input(s): TSH, T4TOTAL, T3FREE, THYROIDAB in the last 72 hours.  Invalid input(s): FREET3 Anemia Panel: No results for input(s): VITAMINB12, FOLATE, FERRITIN, TIBC, IRON, RETICCTPCT in the last 72 hours.   Physical Exam: Blood pressure 165/64, pulse 69, temperature 97.9 F (36.6 C), temperature source Oral, resp. rate 18, height 5\' 9"  (1.753 m), weight 82.01 kg (180 lb 12.8 oz), SpO2 99 %.   Wt  Readings from Last 1 Encounters:  11/05/15 82.01 kg (180 lb 12.8 oz)     General appearance: cooperative Resp: clear to auscultation bilaterally Cardio: bradycardic  TELEMETRY: Reviewed telemetry pt in sinus bradycardia:  ASSESSMENT AND PLAN:  Active Problems:   Syncope-no further episodes past 24 hours. Remains in sinus bradycardia. Stable at present. Blood pressure relatively high . Will need to consider non av nodal active antihypertensives such as hydralazine or amlodipine.     Teodoro Spray., MD, [facility] 11/05/2015 11:43 AM

## 2015-11-05 NOTE — Progress Notes (Signed)

## 2015-11-06 DIAGNOSIS — I214 Non-ST elevation (NSTEMI) myocardial infarction: Secondary | ICD-10-CM | POA: Diagnosis not present

## 2015-11-06 LAB — GLUCOSE, CAPILLARY
GLUCOSE-CAPILLARY: 118 mg/dL — AB (ref 65–99)
GLUCOSE-CAPILLARY: 193 mg/dL — AB (ref 65–99)
GLUCOSE-CAPILLARY: 243 mg/dL — AB (ref 65–99)
Glucose-Capillary: 292 mg/dL — ABNORMAL HIGH (ref 65–99)
Glucose-Capillary: 274 mg/dL — ABNORMAL HIGH (ref 65–99)

## 2015-11-06 NOTE — Progress Notes (Addendum)
Jeffery Schneider at [facility]                                                                                                                                                                                            Patient Demographics   Jeffery Schneider, is a 70 y.o. male, DOB - 1946/03/11, [id number]  Admit date - 11/02/2015   Admitting Physician Nicholes Mango, MD  Outpatient Primary MD for the patient is Jeffery Bill, MD   LOS -   Subjective: Patient admitted with syncope.  Patient does have dementia but able to answer  Some  of the questions appropriately.  Had bath today. And having lunch with assistance.  Review of Systems:   CONSTITUTIONAL: Due to dementia and unable to provide review of systems  Vitals:   Filed Vitals:   11/05/15 2030 11/06/15 0120 11/06/15 0457 11/06/15 1147  BP: 182/66 169/55 138/58 197/66  Pulse: 56 63 58 71  Temp: 98.4 F (36.9 C)  98 F (36.7 C) 98.3 F (36.8 C)  TempSrc: Oral     Resp: 18  18 20   Height:      Weight:   81.829 kg (180 lb 6.4 oz)   SpO2: 94%  99% 98%    Wt Readings from Last 3 Encounters:  11/06/15 81.829 kg (180 lb 6.4 oz)  10/30/15 85.276 kg (188 lb)  09/16/15 83.915 kg (185 lb)     Intake/Output Summary (Last 24 hours) at 11/06/15 1241 Last data filed at 11/06/15 0932  Gross per 24 hour  Intake   1160 ml  Output      0 ml  Net   1160 ml    Physical Exam:   GENERAL: Pleasant-appearing in no apparent distress.  HEAD, EYES, EARS, NOSE AND THROAT: Atraumatic, normocephalic. Extraocular muscles are intact. Pupils equal and reactive to light. Sclerae anicteric. No conjunctival injection. No oro-pharyngeal erythema.  NECK: Supple. There is no jugular venous distention. No bruits, no lymphadenopathy, no thyromegaly.  HEART: Regular rate and rhythm,. No murmurs, no rubs, no clicks.  LUNGS: Clear to auscultation bilaterally. No rales or rhonchi. No wheezes.  ABDOMEN: Soft, flat,  nontender, nondistended. Has good bowel sounds. No hepatosplenomegaly appreciated.  EXTREMITIES: No evidence of any cyanosis, clubbing, or peripheral edema.  +2 pedal and radial pulses bilaterally.  NEUROLOGIC: The patient is alert , has some memory issues secondary to history of meningioma and hemorrhagic stroke.  SKIN: Moist and warm with no rashes appreciated.  Psych: Not anxious, depressed LN: No inguinal LN enlargement    Antibiotics   Anti-infectives    None  Medications   Scheduled Meds: . atorvastatin  40 mg Oral q1800  . docusate sodium  100 mg Oral BID  . finasteride  5 mg Oral Daily  . insulin aspart  0-9 Units Subcutaneous TID AC & HS  . insulin glargine  13 Units Subcutaneous QHS  . ketorolac  1 drop Both Eyes QID  . levETIRAcetam  1,000 mg Oral BID  . lubiprostone  24 mcg Oral Q breakfast  . nystatin   Topical BID  . pantoprazole  40 mg Oral Daily  . prednisoLONE acetate  1 drop Both Eyes QID  . sertraline  50 mg Oral QHS  . sodium chloride flush  3 mL Intravenous Q12H  . terazosin  2 mg Oral QHS   Continuous Infusions:  PRN Meds:.acetaminophen **OR** acetaminophen, hydrALAZINE, ondansetron **OR** ondansetron (ZOFRAN) IV, traZODone   Data Review:   Micro Results No results found for this or any previous visit (from the past 240 hour(s)).  Radiology Reports Dg Chest 2 View  11/02/2015  CLINICAL DATA:  Seizure. EXAM: CHEST  2 VIEW COMPARISON:  September 16, 2015. FINDINGS: Stable cardiomediastinal silhouette. Atherosclerosis of thoracic aorta is noted. No pneumothorax or pleural effusion is noted. Both lungs are clear. The visualized skeletal structures are unremarkable. IMPRESSION: No active cardiopulmonary disease. Electronically Signed   By: Marijo Conception, M.D.   On: 11/02/2015 12:23   Ct Head Wo Contrast  11/02/2015  CLINICAL DATA:  Seizure today, history of seizures, syncope EXAM: CT HEAD WITHOUT CONTRAST TECHNIQUE: Contiguous axial images were  obtained from the base of the skull through the vertex without intravenous contrast. COMPARISON:  09/17/2015 FINDINGS: Brain: No intracranial hemorrhage, mass effect or midline shift. Stable encephalomalacia from prior tumor resection in left frontal lobe. Ex vacuo dilatation of left frontal horn again noted. Ventricular size is stable from prior exam. No definite recurrent mass is noted on this unenhanced scan. Stable atrophy and chronic white matter disease. No definite acute cortical infarction. No intraventricular hemorrhage. Vascular: Extensive atherosclerotic calcifications of carotid siphon again noted. Skull: Again noted status post left frontal craniotomy. No skull fracture is noted. Sinuses/Orbits: The mastoid air cells and visualized sphenoid sinuses are unremarkable. Other: None IMPRESSION: No acute intracranial abnormality. Stable atrophy and chronic white matter disease. No definite acute cortical infarction. Stable postsurgical changes left frontal lobe. No definite recurrent mass is noted on this unenhanced scan. Electronically Signed   By: Lahoma Crocker M.D.   On: 11/02/2015 12:37   US Renal  11/05/2015  CLINICAL DATA:  Acute renal failure EXAM: RENAL / URINARY TRACT ULTRASOUND COMPLETE COMPARISON:  None. FINDINGS: Right Kidney: Length: 12.3 cm. Echogenicity within normal limits. No mass or hydronephrosis visualized. Left Kidney: Length: 11.8 cm. Echogenicity within normal limits. No mass or hydronephrosis visualized. Bladder: Appears normal for degree of bladder distention. The prostate is enlarged indenting upon the bladder inferiorly. IMPRESSION: No acute abnormality noted Electronically Signed   By: Inez Catalina M.D.   On: 11/05/2015 15:51     CBC  Recent Labs Lab 10/30/15 1338 11/02/15 1127 11/03/15 0444  WBC 4.4 4.0 5.0  HGB 11.1* 10.9* 10.1*  HCT 33.3* 32.6* 29.4*  PLT 92* 87* 89*  MCV 80.4 80.5 79.5*  MCH 26.8 26.9 27.4  MCHC 33.3 33.4 34.4  RDW 14.4 14.2 14.2  LYMPHSABS  0.9* 0.9*  --   MONOABS 0.3 0.3  --   EOSABS 0.2 0.2  --   BASOSABS 0.0 0.0  --     Chemistries  Recent Labs Lab 10/30/15 1338 11/02/15 1127 11/03/15 0444 11/04/15 0701  NA 144 144 146* 145  K 4.4 3.8 3.4* 3.5  CL 107 108 114* 113*  CO2 30 31 28 28   GLUCOSE 218* 226* 124* 141*  BUN 20 22* 19 18  CREATININE 1.51* 1.40* 1.27* 1.34*  CALCIUM 9.4 9.5 8.8* 9.1  AST 15 16 14*  --   ALT 15* 13* 12*  --   ALKPHOS 75 73 63  --   BILITOT 0.3 0.5 0.5  --    ------------------------------------------------------------------------------------------------------------------ estimated creatinine clearance is 50.6 mL/min (by C-G formula based on Cr of 1.34). ------------------------------------------------------------------------------------------------------------------  Recent Labs  11/05/15 0458  HGBA1C 8.9*   ------------------------------------------------------------------------------------------------------------------ No results for input(s): CHOL, HDL, LDLCALC, TRIG, CHOLHDL, LDLDIRECT in the last 72 hours. ------------------------------------------------------------------------------------------------------------------ No results for input(s): TSH, T4TOTAL, T3FREE, THYROIDAB in the last 72 hours.  Invalid input(s): FREET3 ------------------------------------------------------------------------------------------------------------------ No results for input(s): VITAMINB12, FOLATE, FERRITIN, TIBC, IRON, RETICCTPCT in the last 72 hours.  Coagulation profile  Recent Labs Lab 11/02/15 1635  INR 1.07    No results for input(s): DDIMER in the last 72 hours.  Cardiac Enzymes  Recent Labs Lab 11/02/15 1127 11/02/15 1351  TROPONINI <0.03 <0.03   ------------------------------------------------------------------------------------------------------------------ Invalid input(s): POCBNP    Assessment & Plan   #syncope probably from dehydration / bradycardia  Suspect  due to dehydration; Seen by neurology  And cardiology. No further workup was recommended by both of them.  Bradycardia seems to be a chronic problem;avoid av nodal blocking agents,., Reviewed the note from cardiology notes from  [facility] according to them patient has a history of tachybradycardia syndrome, but never had a syncope.  They recommend evaluation for  Pacemaker. If pt has symptoms. Cardiology is following. Rate maintained about 70. But no pauses. Has sinus bradycardia with first-degree heart block. Patient was seen by cardiology the year before and the was the referred for pacemaker but the wife wanted second opinion so patient was taken to [facility] cardiology, seen by Dr. Cheri Fowler on February . 20th 2017 Echo normal Essential hypertension: We can use hydralazine 10 mg 4 times daily but I will discuss with the patient's wife before starting the medicine.  # dementia supportive care,  #History of seizure continue Keppra/history of meningioma, status post resection, history of hemorrhagic stroke: Aspirin, Lovenox discontinued due to his history. seen by neurology, no further neurological workup is recommended.  #hyperlipidemia;statins.  Acute renal failure on chronic renal failure: Improved with IV hydration.Renal function at baseline. Seen by nephrology renal ultrasound is within normal limits  Physical therapy recommends home health physical therapy           Start     Ordered   11/02/15 1651  Limited resuscitation (code)   Continuous    Question Answer Comment  In the event of cardiac or respiratory ARREST: Initiate Code Blue, Call Rapid Response Yes   In the event of cardiac or respiratory ARREST: Perform CPR Yes   In the event of cardiac or respiratory ARREST: Perform Intubation/Mechanical Ventilation Yes   In the event of cardiac or respiratory ARREST: Use NIPPV/BiPAp only if indicated Yes   In the event of cardiac or respiratory ARREST: Administer ACLS medications if indicated  Yes   In the event of cardiac or respiratory ARREST: Perform Defibrillation or Cardioversion if indicated No      11/02/15 1650    Code Status History    Date Active Date Inactive Code Status Order ID Comments User  Context   11/23/2014  3:43 PM 11/24/2014  8:00 PM Full Code [id number]  Melton Alar, PA-C Inpatient    Advance Directive Documentation        Most Recent Value   Type of Advance Directive  Healthcare Power of Attorney   Pre-existing out of facility DNR order (yellow form or pink MOST form)     "MOST" Form in Place?             Consults  Cardiology  DVT Prophylaxis  due to history of hemorrhagic CVA Lovenox has been discontinued  Lab Results  Component Value Date   PLT 89* 11/03/2015    Already told dietary in instruction not give orange juice and coffee with patient. Time Spent in minutes   25 min  Greater than 50% of time spent in care coordination and counseling patient regarding the condition and plan of care.   Epifanio Lesches M.D on 11/06/2015 at 12:41 PM  Between 7am to 6pm - Pager - [phone]  After 6pm go to [email or web address] - password [email or web address]   Office  [phone]

## 2015-11-06 NOTE — Progress Notes (Signed)
Initial Nutrition Assessment  DOCUMENTATION CODES:   Not applicable  INTERVENTION:  -Cater to pt preferences; discussed in detail with wife. No coffee, no seafood. Pt needs chopped meats as well but will not downgrade to Dysphagia III diet as pt likes to eat salads and coleslaw and tolerates these well per wife. Pt takes bedtime snack at home of a whole sandwich, orders entered per wife's request.  -Discussed nutritional supplements with wife; but wife declines at this time due to blood sugar concerns.    NUTRITION DIAGNOSIS:     Reassess on follow  GOAL:   Patient will meet greater than or equal to 90% of their needs  MONITOR:   PO intake, Weight trends, Labs  REASON FOR ASSESSMENT:   Consult Assessment of nutrition requirement/status  ASSESSMENT:    70 yo male admitted with syncope, NSTEMI; pt with hx of hemorrhagic stroke, dementia at baseline. Noted wife has requested transfer to [facility]  Wife reports pt had feeding tube from 2014 until 2016 when tube was removed after pt passed a swallow study and was tolerating po intake well. Pt has not used nutritional supplements since this time. Wife reports that pt tolerates thin liquids at home, no problems chewing or swallowing but they do observe aspiration precautions. Wife reports pt has a good appetite at home most of the time; occasionally he has days where he does not eat well (she can usually tell this at breakfast time) but mostly he eats well. Pt typically eats oatmeal with sausage/bacon or breakfast sandwich with milk, prune juice and water at breakfast. Pt eats lean meats and veggies at lunch and dinner and occasionally a fruit snack. Pt has bedtime snack of a whole sandwich as well. Pt drinks milk and water at meal times; sometimes iced tea  Past Medical History  Diagnosis Date  . Hypertension   . Diabetes mellitus without complication ([place])   . Brain tumor ([place])   . Stroke ([place])   . Kidney disease   . Hyperlipidemia   .  Bradycardia   . Syncope   . Abnormal EKG   . Dementia   . GERD (gastroesophageal reflux disease)   . Dysphagia   . Diabetes ([place])   . Constipation   . Seizures ([place])   . Sickle cell trait ([place])   . Benign prostatic hyperplasia   . Chronic kidney disease      Diet Order:  Diet heart healthy/carb modified Room service appropriate?: Yes; Fluid consistency:: Thin   Energy Intake: recorded po intake 66% of meals on average  Skin:  Reviewed, no issues  Last BM:  11/01/15  Height:   Ht Readings from Last 1 Encounters:  11/02/15 5\' 9"  (1.753 m)    Weight: wife reports weight fluctuates up and down but has overall be very stable  Wt Readings from Last 1 Encounters:  11/06/15 180 lb 6.4 oz (81.829 kg)    Unable to complete Nutrition-Focused physical exam at this time.   BMI:  Body mass index is 26.63 kg/(m^2).  Estimated Nutritional Needs:   Kcal:  1800-2100 kcals   Protein:  81-97 g  Fluid:  >/= 1.8 L per day  EDUCATION NEEDS:   Education needs addressed  Kerman Passey [place], [place], LDN [phone] Pager  [phone] Weekend/On-Call Pager

## 2015-11-06 NOTE — Progress Notes (Signed)
Inpatient Diabetes Program Recommendations  AACE/ADA: New Consensus Statement on Inpatient Glycemic Control (2015)  Target Ranges:  Prepandial:   less than 140 mg/dL      Peak postprandial:   less than 180 mg/dL (1-2 hours)      Critically ill patients:  140 - 180 mg/dL  Results for LORIMER, TAKASHIMA (MRN [id number]) as of 11/06/2015 09:40  Ref. Range 11/05/2015 07:53 11/05/2015 11:35 11/05/2015 17:02 11/05/2015 21:06 11/06/2015 07:28  Glucose-Capillary Latest Ref Range: 65-99 mg/dL 75 117 (H) 382 (H) 392 (H) 118 (H)  Results for JAQUAVEON, VANOS (MRN [id number]) as of 11/06/2015 09:40  Ref. Range 11/04/2015 07:38 11/04/2015 16:23 11/04/2015 21:09  Glucose-Capillary Latest Ref Range: 65-99 mg/dL 134 (H) 297 (H) 254 (H)   Review of Glycemic Control  Diabetes history: DM2 Outpatient Diabetes medications: Lantus 12 units QHS, Novolog 6-7 units TID with meals Current orders for Inpatient glycemic control: Lantus 13 units QHS, Novolog 0-9 units ACHS  Inpatient Diabetes Program Recommendations: Insulin - Meal Coverage: Post prandial glucose consistently elevated. Please consider ordering Novolog 5 units TID with meals if patient eats at least 50% of meals.  Thanks, Barnie Alderman, RN, MSN, CDE Diabetes Coordinator Inpatient Diabetes Program [phone] (Team Pager from [place] to [place]) [phone] ([place] office) [phone] [facility] office) [phone] [facility] office)

## 2015-11-06 NOTE — Progress Notes (Signed)
Wife expressed displeasure that patient hd Oj and sherbet in room, rechecked his blood sugar for her.  Was 243.  ahe would like transfer to [facility], transfer initiated by [place]

## 2015-11-06 NOTE — Discharge Summary (Signed)
Jeffery Schneider, is a 70 y.o. male  DOB 06/15/1946  MRN [id number].  Admission date:  11/02/2015  Admitting Physician  Nicholes Mango, MD  Discharge Date:  11/06/2015   Primary MD  [name], MD  Recommendations for primary care physician for things to follow:   Being transferred to [facility]. Admission Diagnosis  NSTEMI (non-ST elevated myocardial infarction) ([place]) [I21.4] Syncope, unspecified syncope type [R55]   Discharge Diagnosis  NSTEMI (non-ST elevated myocardial infarction) ([place]) [I21.4] Syncope, unspecified syncope type [R55]    Active Problems:   Syncope      Past Medical History  Diagnosis Date  . Hypertension   . Diabetes mellitus without complication ([place])   . Brain tumor ([place])   . Stroke ([place])   . Kidney disease   . Hyperlipidemia   . Bradycardia   . Syncope   . Abnormal EKG   . Dementia   . GERD (gastroesophageal reflux disease)   . Dysphagia   . Diabetes ([place])   . Constipation   . Seizures ([place])   . Sickle cell trait ([place])   . Benign prostatic hyperplasia   . Chronic kidney disease     Past Surgical History  Procedure Laterality Date  . Cardiac catheterization    . Craniotomy for tumor      frontal lobe       History of present illness and  Hospital Course:     Kindly see H&P for history of present illness and admission details, please review complete Labs, Consult reports and Test reports for all details in brief  HPI  from the history and physical done on the day of admission 35-year-old male patient with history of diabetes mellitus type 2, essential hypertension, hemorrhagic stroke admitted because SYNCOPE.   Hospital Course   #1 syncope: Patient was at the breakfast table, just finished breakfast, he fell down to his chest. EMS was called and brought in. As per  wife patient did not have any shaking episodes. admitted to telemetry, initial set of troponins are negative. Patient is seen by cardiology, did have echocardiogram. Echocardiogram showed EF more than 60%. The patient MA showed sinus bradycardia with a rate up to 45-50 bpm cardiology did not think patient needs pacemaker. But the family requested transfer to [facility] for further evaluation for syncope. Asian has been accepted by [facility], accepting physician Dr. Chong Sicilian from cardiology, I spoke with Dr. Gerome Sam. cardiology fellow. #2. History of hemorrhagic stroke, history of meningioma resection, history of seizures: Patient seen by neurology, for further neurological workup recommended initial head CT did not show any new changes. Patient is on Keppra 1 g by mouth twice a day. 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 Seen by nephrology, patient received IV hydration, admission patient creatinine was 1.40. With IV hydration. Patient creatinine dropped to 1.34 and GFR 60. Renal ultrasound did not show any hydronephrosis, renal ultrasound is normal. #4 hyperlipidemia: Patient LDL 146. Started on statins. Patient was not on statins at home. Diabetes mellitus type 2: Patient is on Lantus 13 units daily at bedtime. Also on sliding scale with coverage. Patient is seen by diabetes coordinator today. She recommended NovoLog 5 units 3 times a day with meals but the wife requested a transfer to [facility]. #4 essential hypertension  Essential hypertension cardiology recommended starting the patient on hydralazine but his  wife requested that she be informed in all medical  decisions. We will planned  to start the patient on hydralazine but now patient is going to [facility].   Discharge Condition:stable   Follow UP      Discharge Instructions  and  Discharge Medications        Medication List    ASK your doctor about these  medications        finasteride 5 MG tablet  Commonly known as:  PROSCAR  Take 5 mg by mouth daily.     insulin aspart 100 UNIT/ML injection  Commonly known as:  novoLOG  Inject 6-7 Units into the skin 3 (three) times daily before meals. Sliding scale: 151-200=1 unit, 201-250=2 unit, 251-300=3 units, 301-350=4 units, 351-400=5 units, over 400 call dr     insulin glargine 100 UNIT/ML injection  Commonly known as:  LANTUS  Inject 12 Units into the skin at bedtime.     levETIRAcetam 1000 MG tablet  Commonly known as:  KEPPRA  Take 1,000 mg by mouth 2 (two) times daily.     lubiprostone 24 MCG capsule  Commonly known as:  AMITIZA  Take 24 mcg by mouth daily with breakfast.     Melatonin 2.5 MG Caps  Take 2.5 mg by mouth daily.     pantoprazole 40 MG tablet  Commonly known as:  PROTONIX  Take 40 mg by mouth daily.     sertraline 100 MG tablet  Commonly known as:  ZOLOFT  Take 50 mg by mouth at bedtime.     terazosin 2 MG capsule  Commonly known as:  HYTRIN  Take 2 mg by mouth at bedtime.     traZODone 50 MG tablet  Commonly known as:  DESYREL  Take 50 mg by mouth at bedtime as needed for sleep.          Diet and Activity recommendation: See Discharge Instructions above   Consults obtained -cardiology neurology nephrology   Major procedures and Radiology Reports - PLEASE review detailed and final reports for all details, in brief -     Dg Chest 2 View  11/02/2015  CLINICAL DATA:  Seizure. EXAM: CHEST  2 VIEW COMPARISON:  September 16, 2015. FINDINGS: Stable cardiomediastinal silhouette. Atherosclerosis of thoracic aorta is noted. No pneumothorax or pleural effusion is noted. Both lungs are clear. The visualized skeletal structures are unremarkable. IMPRESSION: No active cardiopulmonary disease. Electronically Signed   By: Marijo Conception, M.D.   On: 11/02/2015 12:23   Ct Head Wo Contrast  11/02/2015  CLINICAL DATA:  Seizure today, history of seizures, syncope EXAM:  CT HEAD WITHOUT CONTRAST TECHNIQUE: Contiguous axial images were obtained from the base of the skull through the vertex without intravenous contrast. COMPARISON:  09/17/2015 FINDINGS: Brain: No intracranial hemorrhage, mass effect or midline shift. Stable encephalomalacia from prior tumor resection in left frontal lobe. Ex vacuo dilatation of left frontal horn again noted. Ventricular size is stable from prior exam. No definite recurrent mass is noted on this unenhanced scan. Stable atrophy and chronic white matter disease. No definite acute cortical infarction. No intraventricular hemorrhage. Vascular: Extensive atherosclerotic calcifications of carotid siphon again noted. Skull: Again noted status post left frontal craniotomy. No skull fracture is noted. Sinuses/Orbits: The mastoid air cells and visualized sphenoid sinuses are unremarkable. Other: None IMPRESSION: No acute intracranial abnormality. Stable atrophy and chronic white matter disease. No definite acute cortical infarction. Stable postsurgical changes left frontal lobe. No definite recurrent mass is noted on this unenhanced scan. Electronically Signed   By: Julien Girt  Pop M.D.   On: 11/02/2015 12:37   US Renal  11/05/2015  CLINICAL DATA:  Acute renal failure EXAM: RENAL / URINARY TRACT ULTRASOUND COMPLETE COMPARISON:  None. FINDINGS: Right Kidney: Length: 12.3 cm. Echogenicity within normal limits. No mass or hydronephrosis visualized. Left Kidney: Length: 11.8 cm. Echogenicity within normal limits. No mass or hydronephrosis visualized. Bladder: Appears normal for degree of bladder distention. The prostate is enlarged indenting upon the bladder inferiorly. IMPRESSION: No acute abnormality noted Electronically Signed   By: Inez Catalina M.D.   On: 11/05/2015 15:51    Micro Results   No results found for this or any previous visit (from the past 240 hour(s)).     Today   Subjective:   Jeffery Schneider today Has baseline dementia, unable to  communicate much.   Objective:   Blood pressure 197/66, pulse 71, temperature 98.3 F (36.8 C), temperature source Oral, resp. rate 20, height 5\' 9"  (1.753 m), weight 81.829 kg (180 lb 6.4 oz), SpO2 98 %.   Intake/Output Summary (Last 24 hours) at 11/06/15 1355 Last data filed at 11/06/15 1349  Gross per 24 hour  Intake   1160 ml  Output      0 ml  Net   1160 ml    Exam Awake Alert,  No new F.N deficits, Normal affect [place].AT,PERRAL Supple Neck,No JVD, No cervical lymphadenopathy appriciated.  Symmetrical Chest wall movement, Good air movement bilaterally, CTAB RRR,No Gallops,Rubs or new Murmurs, No Parasternal Heave +ve B.Sounds, Abd Soft, Non tender, No organomegaly appriciated, No rebound -guarding or rigidity. No Cyanosis, Clubbing or edema, No new Rash or bruise  Data Review   CBC w Diff: Lab Results  Component Value Date   WBC 5.0 11/03/2015   HGB 10.1* 11/03/2015   HCT 29.4* 11/03/2015   PLT 89* 11/03/2015   LYMPHOPCT 24% 11/02/2015   MONOPCT 8% 11/02/2015   EOSPCT 5% 11/02/2015   BASOPCT 0% 11/02/2015    CMP: Lab Results  Component Value Date   NA 145 11/04/2015   K 3.5 11/04/2015   CL 113* 11/04/2015   CO2 28 11/04/2015   BUN 18 11/04/2015   CREATININE 1.34* 11/04/2015   PROT 6.0* 11/03/2015   ALBUMIN 3.4* 11/03/2015   BILITOT 0.5 11/03/2015   ALKPHOS 63 11/03/2015   AST 14* 11/03/2015   ALT 12* 11/03/2015  .   Total Time in preparing paper work, data evaluation and todays exam - 52 minutes  Danai Gotto M.D on 11/06/2015 at 1:55 PM    Note: This dictation was prepared with Dragon dictation along with smaller phrase technology. Any transcriptional errors that result from this process are unintentional.

## 2015-11-06 NOTE — Progress Notes (Signed)
Per MD Vianne Bulls order a sitter for safety and impulsiveness

## 2015-11-07 DIAGNOSIS — I214 Non-ST elevation (NSTEMI) myocardial infarction: Secondary | ICD-10-CM | POA: Diagnosis not present

## 2015-11-07 LAB — GLUCOSE, CAPILLARY: Glucose-Capillary: 111 mg/dL — ABNORMAL HIGH (ref 65–99)

## 2015-11-07 MED ORDER — INSULIN ASPART 100 UNIT/ML ~~LOC~~ SOLN
5.0000 [IU] | Freq: Once | SUBCUTANEOUS | Status: AC
  Administered 2015-11-07: 5 [IU] via SUBCUTANEOUS
  Filled 2015-11-07: qty 5

## 2015-11-07 NOTE — Progress Notes (Signed)
Inpatient Diabetes Program Recommendations  AACE/ADA: New Consensus Statement on Inpatient Glycemic Control (2015)  Target Ranges:  Prepandial:   less than 140 mg/dL      Peak postprandial:   less than 180 mg/dL (1-2 hours)      Critically ill patients:  140 - 180 mg/dL  Results for Jeffery Schneider, Jeffery Schneider (MRN [id number]) as of 11/07/2015 08:49  Ref. Range 11/06/2015 07:28 11/06/2015 12:04 11/06/2015 13:56 11/06/2015 16:21 11/06/2015 21:02 11/07/2015 07:29  Glucose-Capillary Latest Ref Range: 65-99 mg/dL 118 (H) 193 (H) 243 (H) 292 (H) 274 (H) 111 (H)   Review of Glycemic Control  Diabetes history: DM2 Outpatient Diabetes medications: Lantus 12 units QHS, Novolog 6-7 units TID with meals Current orders for Inpatient glycemic control: Lantus 13 units QHS, Novolog 0-9 units ACHS  Inpatient Diabetes Program Recommendations: Insulin - Meal Coverage: Post prandial glucose consistently elevated. Please consider ordering Novolog 5 units TID with meals if patient eats at least 50% of meals.  Thanks, Barnie Alderman, RN, MSN, CDE Diabetes Coordinator Inpatient Diabetes Program [phone] (Team Pager from [place] to [place]) [phone] ([place] office) [phone] [facility] office) [phone] [facility] office)

## 2015-11-07 NOTE — Progress Notes (Signed)
Physical Therapy Treatment Patient Details Name: Pius Bulson MRN: [id number] DOB: [birth year] Today's Date: 11/07/2015    History of Present Illness zyren rosenbaum is a 70yo black male who comes to [facility] after syncope at home. 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 Pt found to be dehydrated. Neuro was consulted who does not feel that this episode was epileptic in nature.     PT Comments    Pt initially refused session this am but agreed later in am.  To edge of bed with rail and supervision.  Pt was able to ambulate 200' with slow gait.  Pt with dec step height and length bilaterally with very narrow bos.  Verbal dues to attempt to correct deficits with little improvement and seemed to confuse pt more. Pt needs assist to turn with walker as he leaves it to the side and ends up outside walker box.  Pt's wife in for session and observed ambulation.  Stated he is at/near his baseline and she needed to ambulate/transfer his at all times at home for safety and is comfortable with that at home.     Follow Up Recommendations  Home health PT     Equipment Recommendations  None recommended by PT    Recommendations for Other Services       Precautions / Restrictions Restrictions Weight Bearing Restrictions: No    Mobility  Bed Mobility Overal bed mobility: Modified Independent                Transfers Overall transfer level: Needs assistance Equipment used: Rolling walker (2 wheeled) Transfers: Sit to/from Stand Sit to Stand: Min guard         General transfer comment: verbal cues for hand placements  Ambulation/Gait Ambulation/Gait assistance: Min guard Ambulation Distance (Feet): 200 Feet Assistive device: Rolling walker (2 wheeled) Gait Pattern/deviations: Step-through pattern;Decreased step length - right;Decreased step length - left;Shuffle (narrow BOS)   Gait velocity interpretation: <1.8 ft/sec, indicative of  risk for recurrent falls General Gait Details: Narrow BOS and poor turning - verbala and physical cues to assist   Stairs            Wheelchair Mobility    Modified Rankin (Stroke Patients Only)       Balance Overall balance assessment: Needs assistance Sitting-balance support: Feet unsupported Sitting balance-Leahy Scale: Good     Standing balance support: Bilateral upper extremity supported Standing balance-Leahy Scale: Fair                      Cognition Arousal/Alertness: Awake/alert Behavior During Therapy: WFL for tasks assessed/performed Overall Cognitive Status: History of cognitive impairments - at baseline                      Exercises      General Comments        Pertinent Vitals/Pain Pain Assessment: No/denies pain    Home Living                      Prior Function            PT Goals (current goals can now be found in the care plan section) Progress towards PT goals: Progressing toward goals    Frequency  Min 2X/week    PT Plan Current plan remains appropriate    Co-evaluation             End of Session  Equipment Utilized During Treatment: Gait belt Activity Tolerance: Patient tolerated treatment well Patient left: in chair;with call bell/phone within reach;with chair alarm set;with nursing/sitter in room;with family/visitor present     Time: [id number] PT Time Calculation (min) (ACUTE ONLY): 25 min  Charges:  $Gait Training: 23-37 mins                    G Codes:      Chesley Noon, PTA 11/07/2015, 10:33 AM

## 2015-11-07 NOTE — Progress Notes (Signed)
EMS is here now to transport patient. Wife has packed up belongings. Report was called to RN Chrys Racer at accepting unit at [facility]

## 2015-11-07 NOTE — Progress Notes (Signed)
Wife is at bedside and checked the patient's blood sugar on her own. Called RN and stated CBG is currently 368 and she is requesting I speak with the doctor about his blood sugars. Dr. Vianne Bulls paged and MD stated to give a one time dose of 5 units of insulin. Dr. Vianne Bulls also notified that patient's wife would like to talk to the doctor.

## 2015-11-07 NOTE — Progress Notes (Signed)
[place] at [facility]                                                                                                                                                                                            Patient Demographics   Jeffery Schneider, is a 70 y.o. male, DOB - [birth year], [id number]  Admit date - 11/02/2015   Admitting Physician Nicholes Mango, MD  Outpatient Primary MD for the patient is [name], MD   LOS -   Subjective: Patient admitted with syncope. CT of the head did not show any acute changes. Patient is waiting for bed at [facility] , transfer initiated as per family request. Patient was pulling off the telemetry leads ,so we had the sitter since yesterday.  Review of Systems:   CONSTITUTIONAL: Due to dementia and unable to provide review of systems Able to answer some questions appropriately, denies chest pain, shortness of breath. No weakness of hands and legs. No nausea or vomiting. Able to eat for breakfast with assistance. Vitals:   Filed Vitals:   11/06/15 2008 11/07/15 0021 11/07/15 0350 11/07/15 1116  BP: 193/55 128/73 126/57 148/59  Pulse: 49 67 59 65  Temp:   97.9 F (36.6 C) 98.4 F (36.9 C)  TempSrc:   Oral Oral  Resp:   18 18  Height:      Weight:   82.192 kg (181 lb 3.2 oz)   SpO2:   98% 97%    Wt Readings from Last 3 Encounters:  11/07/15 82.192 kg (181 lb 3.2 oz)  10/30/15 85.276 kg (188 lb)  09/16/15 83.915 kg (185 lb)     Intake/Output Summary (Last 24 hours) at 11/07/15 1305 Last data filed at 11/07/15 0950  Gross per 24 hour  Intake    600 ml  Output      0 ml  Net    600 ml    Physical Exam:   GENERAL: Pleasant-appearing in no apparent distress.  HEAD, EYES, EARS, NOSE AND THROAT: Atraumatic, normocephalic. Extraocular muscles are intact. Pupils equal and reactive to light. Sclerae anicteric. No conjunctival injection. No oro-pharyngeal erythema.  NECK: Supple. There is no  jugular venous distention. No bruits, no lymphadenopathy, no thyromegaly.  HEART: Regular rate and rhythm,. No murmurs, no rubs, no clicks.  LUNGS: Clear to auscultation bilaterally. No rales or rhonchi. No wheezes.  ABDOMEN: Soft, flat, nontender, nondistended. Has good bowel sounds. No hepatosplenomegaly appreciated.  EXTREMITIES: No evidence of any cyanosis, clubbing, or peripheral edema.  +2 pedal and radial pulses bilaterally.  NEUROLOGIC: The patient is alert , has  some memory issues secondary to history of meningioma and hemorrhagic stroke.  SKIN: Moist and warm with no rashes appreciated.  Psych: Not anxious, depressed LN: No inguinal LN enlargement    Antibiotics   Anti-infectives    None      Medications   Scheduled Meds: . atorvastatin  40 mg Oral q1800  . docusate sodium  100 mg Oral BID  . finasteride  5 mg Oral Daily  . insulin aspart  0-9 Units Subcutaneous TID AC & HS  . insulin glargine  13 Units Subcutaneous QHS  . ketorolac  1 drop Both Eyes QID  . levETIRAcetam  1,000 mg Oral BID  . lubiprostone  24 mcg Oral Q breakfast  . nystatin   Topical BID  . pantoprazole  40 mg Oral Daily  . prednisoLONE acetate  1 drop Both Eyes QID  . sertraline  50 mg Oral QHS  . sodium chloride flush  3 mL Intravenous Q12H  . terazosin  2 mg Oral QHS   Continuous Infusions:  PRN Meds:.acetaminophen **OR** acetaminophen, hydrALAZINE, ondansetron **OR** ondansetron (ZOFRAN) IV, traZODone   Data Review:   Micro Results No results found for this or any previous visit (from the past 240 hour(s)).  Radiology Reports Dg Chest 2 View  11/02/2015  CLINICAL DATA:  Seizure. EXAM: CHEST  2 VIEW COMPARISON:  September 16, 2015. FINDINGS: Stable cardiomediastinal silhouette. Atherosclerosis of thoracic aorta is noted. No pneumothorax or pleural effusion is noted. Both lungs are clear. The visualized skeletal structures are unremarkable. IMPRESSION: No active cardiopulmonary disease.  Electronically Signed   By: Marijo Conception, M.D.   On: 11/02/2015 12:23   Ct Head Wo Contrast  11/02/2015  CLINICAL DATA:  Seizure today, history of seizures, syncope EXAM: CT HEAD WITHOUT CONTRAST TECHNIQUE: Contiguous axial images were obtained from the base of the skull through the vertex without intravenous contrast. COMPARISON:  09/17/2015 FINDINGS: Brain: No intracranial hemorrhage, mass effect or midline shift. Stable encephalomalacia from prior tumor resection in left frontal lobe. Ex vacuo dilatation of left frontal horn again noted. Ventricular size is stable from prior exam. No definite recurrent mass is noted on this unenhanced scan. Stable atrophy and chronic white matter disease. No definite acute cortical infarction. No intraventricular hemorrhage. Vascular: Extensive atherosclerotic calcifications of carotid siphon again noted. Skull: Again noted status post left frontal craniotomy. No skull fracture is noted. Sinuses/Orbits: The mastoid air cells and visualized sphenoid sinuses are unremarkable. Other: None IMPRESSION: No acute intracranial abnormality. Stable atrophy and chronic white matter disease. No definite acute cortical infarction. Stable postsurgical changes left frontal lobe. No definite recurrent mass is noted on this unenhanced scan. Electronically Signed   By: Lahoma Crocker M.D.   On: 11/02/2015 12:37   US Renal  11/05/2015  CLINICAL DATA:  Acute renal failure EXAM: RENAL / URINARY TRACT ULTRASOUND COMPLETE COMPARISON:  None. FINDINGS: Right Kidney: Length: 12.3 cm. Echogenicity within normal limits. No mass or hydronephrosis visualized. Left Kidney: Length: 11.8 cm. Echogenicity within normal limits. No mass or hydronephrosis visualized. Bladder: Appears normal for degree of bladder distention. The prostate is enlarged indenting upon the bladder inferiorly. IMPRESSION: No acute abnormality noted Electronically Signed   By: Inez Catalina M.D.   On: 11/05/2015 15:51      CBC  Recent Labs Lab 11/02/15 1127 11/03/15 0444  WBC 4.0 5.0  HGB 10.9* 10.1*  HCT 32.6* 29.4*  PLT 87* 89*  MCV 80.5 79.5*  MCH 26.9 27.4  MCHC 33.4 34.4  RDW 14.2 14.2  LYMPHSABS 0.9*  --   MONOABS 0.3  --   EOSABS 0.2  --   BASOSABS 0.0  --     Chemistries   Recent Labs Lab 11/02/15 1127 11/03/15 0444 11/04/15 0701  NA 144 146* 145  K 3.8 3.4* 3.5  CL 108 114* 113*  CO2 31 28 28   GLUCOSE 226* 124* 141*  BUN 22* 19 18  CREATININE 1.40* 1.27* 1.34*  CALCIUM 9.5 8.8* 9.1  AST 16 14*  --   ALT 13* 12*  --   ALKPHOS 73 63  --   BILITOT 0.5 0.5  --    ------------------------------------------------------------------------------------------------------------------ estimated creatinine clearance is 50.6 mL/min (by C-G formula based on Cr of 1.34). ------------------------------------------------------------------------------------------------------------------  Recent Labs  11/05/15 0458  HGBA1C 8.9*   ------------------------------------------------------------------------------------------------------------------ No results for input(s): CHOL, HDL, LDLCALC, TRIG, CHOLHDL, LDLDIRECT in the last 72 hours. ------------------------------------------------------------------------------------------------------------------ No results for input(s): TSH, T4TOTAL, T3FREE, THYROIDAB in the last 72 hours.  Invalid input(s): FREET3 ------------------------------------------------------------------------------------------------------------------ No results for input(s): VITAMINB12, FOLATE, FERRITIN, TIBC, IRON, RETICCTPCT in the last 72 hours.  Coagulation profile  Recent Labs Lab 11/02/15 1635  INR 1.07    No results for input(s): DDIMER in the last 72 hours.  Cardiac Enzymes  Recent Labs Lab 11/02/15 1127 11/02/15 1351  TROPONINI <0.03 <0.03    ------------------------------------------------------------------------------------------------------------------ Invalid input(s): POCBNP    Assessment & Plan   #syncope probably from dehydration / bradycardia  Suspect due to dehydration;Improved with hydration. Renal function improved with hydration. Patient appears euvolemic at this time. Seen by neurology  And cardiology. No further workup was recommended by both of them. We'll order ultrasound of carotids today after discussing with wife because of risk factors of hyperlipidemia, diabetes mellitus, hypertension Waiting for bed at [facility] Accepted by [facility], spoke with cardiology fellow yesterday morning.  #Bradycardia seems to be a chronic problem;avoid av nodal blocking agents,., Reviewed the note from cardiology notes from  [facility], according to them patient has a history of tachybradycardia syndrome, but never had a syncope.  They recommend evaluation for  Pacemaker. If pt has symptoms. Cardiology is following. Rate maintained about 70. But no pauses. Has sinus bradycardia with first-degree heart block. Patient was seen by cardiology the year before and the was the referred for pacemaker but the wife wanted second opinion so patient was taken to [facility] cardiology, seen by Dr. Cheri Fowler on February . 20th 2017 Echo normal this admission: Essential hypertension:  # dementia supportive care,  #History of seizure continue Keppra/history of meningioma, status post resection, history of hemorrhagic stroke: Aspirin, Lovenox discontinued due to his history. seen by neurology, no further neurological workup is recommended.  #hyperlipidemia; continue statins.  Acute renal failure on chronic renal failure: Improved with IV hydration.Renal function at baseline. Seen by nephrology renal ultrasound is within normal limits  #Physical therapy recommends home health physical therapy Got update from [facility] that the patient is still on  waiting list.  Diabetes mellitus type 2: Uncontrolled with hemoglobin A1c 8.9.  He  is on Levemir, sliding scale with coverage. Patient needs to be on low-sodium ADA diet   With Dr. Ubaldo Glassing  he recommended pacemaker because of bradycardia and syncope., He said he will talk to the wife. If he says pt can be discharged ,I can talk to wife and discharge today,          Start     Ordered   11/02/15 1651  Limited resuscitation (  code)   Continuous    Question Answer Comment  In the event of cardiac or respiratory ARREST: Initiate Code Blue, Call Rapid Response Yes   In the event of cardiac or respiratory ARREST: Perform CPR Yes   In the event of cardiac or respiratory ARREST: Perform Intubation/Mechanical Ventilation Yes   In the event of cardiac or respiratory ARREST: Use NIPPV/BiPAp only if indicated Yes   In the event of cardiac or respiratory ARREST: Administer ACLS medications if indicated Yes   In the event of cardiac or respiratory ARREST: Perform Defibrillation or Cardioversion if indicated No      11/02/15 1650    Code Status History    Date Active Date Inactive Code Status Order ID Comments User Context   11/23/2014  3:43 PM 11/24/2014  8:00 PM Full Code [id number]  Melton Alar, PA-C Inpatient    Advance Directive Documentation        Most Recent Value   Type of Advance Directive  Healthcare Power of Attorney   Pre-existing out of facility DNR order (yellow form or pink MOST form)     "MOST" Form in Place?             Consults  Cardiology  DVT Prophylaxis  due to history of hemorrhagic CVA Lovenox has been discontinued  Lab Results  Component Value Date   PLT 89* 11/03/2015    Already told dietary in instruction not give orange juice and coffee with patient. Time Spent in minutes   25 min  Greater than 50% of time spent in care coordination and counseling patient regarding the condition and plan of care.   Epifanio Lesches M.D on 11/07/2015 at 1:05 PM  Between  7am to 6pm - Pager - [phone]  After 6pm go to [email or web address] - password [email or web address] [facility]   Office  [phone]

## 2015-11-07 NOTE — Care Management (Signed)
Patient has a bed at [facility]. Primary RN to arrange transfer.

## 2015-11-07 NOTE — Progress Notes (Signed)
[place] [facility]  SUBJECTIVE: difficult historian. Has intermitant sinus rhythm/sinus bradycardia. No prolonged pauses noted on telemetry. No prolong tachyarrhythmias.   Filed Vitals:   11/06/15 2008 11/07/15 0021 11/07/15 0350 11/07/15 1116  BP: 193/55 128/73 126/57 148/59  Pulse: 49 67 59 65  Temp:   97.9 F (36.6 C) 98.4 F (36.9 C)  TempSrc:   Oral Oral  Resp:   18 18  Height:      Weight:   82.192 kg (181 lb 3.2 oz)   SpO2:   98% 97%    Intake/Output Summary (Last 24 hours) at 11/07/15 1314 Last data filed at 11/07/15 0950  Gross per 24 hour  Intake    600 ml  Output      0 ml  Net    600 ml    LABS: Basic Metabolic Panel: No results for input(s): NA, K, CL, CO2, GLUCOSE, BUN, CREATININE, CALCIUM, MG, PHOS in the last 72 hours. Liver Function Tests: No results for input(s): AST, ALT, ALKPHOS, BILITOT, PROT, ALBUMIN in the last 72 hours. No results for input(s): LIPASE, AMYLASE in the last 72 hours. CBC: No results for input(s): WBC, NEUTROABS, HGB, HCT, MCV, PLT in the last 72 hours. Cardiac Enzymes: No results for input(s): CKTOTAL, CKMB, CKMBINDEX, TROPONINI in the last 72 hours. BNP: Invalid input(s): POCBNP D-Dimer: No results for input(s): DDIMER in the last 72 hours. Hemoglobin A1C:  Recent Labs  11/05/15 0458  HGBA1C 8.9*   Fasting Lipid Panel: No results for input(s): CHOL, HDL, LDLCALC, TRIG, CHOLHDL, LDLDIRECT in the last 72 hours. Thyroid Function Tests: No results for input(s): TSH, T4TOTAL, T3FREE, THYROIDAB in the last 72 hours.  Invalid input(s): FREET3 Anemia Panel: No results for input(s): VITAMINB12, FOLATE, FERRITIN, TIBC, IRON, RETICCTPCT in the last 72 hours.   Physical Exam: Blood pressure 148/59, pulse 65, temperature 98.4 F (36.9 C), temperature source Oral, resp. rate 18, height 5\' 9"  (1.753 m), weight 82.192 kg (181 lb 3.2 oz), SpO2 97 %.   Wt Readings from Last 1 Encounters:  11/07/15 82.192  kg (181 lb 3.2 oz)     General appearance: cooperative Resp: clear to auscultation bilaterally Cardio: regular rhythm with ectopy. Neurologic: Mental status: orientation: person  TELEMETRY: Reviewed telemetry pt in sinus rhythm,sinus arrhythmia and sinus bradycardia:  ASSESSMENT AND PLAN:  Active Problems:   Syncope-etiology is unclear. Has baseline sinus brady. Transient worsening of brady may have been present vs transient vasovagal hypotension vs neurologic etiolgy. No prolonged pauses or escessive hemodynamicly significant bradyc since admission. Had been considered for ppm in the past. Non invasive workup last month at [facility]  With [facility] in 11/16 revealed sinus brady with 1st degree av block but no higher degree. Nonsustained svt was noted. Did not appear to require ppm based on this. Echo done during this admission revealed ef of 55-65% with no high grade valvular disease. Renal function has stabilized with gfr of 60. Has not had significant hypotension and has ruled out for mi. COnsider further evaluation at [facility] perfamily request vs repeat event monitor as outpatient.     Teodoro Spray., MD, [facility] 11/07/2015 1:14 PM

## 2015-11-07 NOTE — Care Management (Signed)
Patient medically stable for discharge. Spoke with Dr. Vianne Bulls who states he is stable but she will not be discharging patient and needs to discuss this further with wife. Wife requesting transfer to [facility] for second opinion. Case discussed with Dr. Doy Hutching who agrees patient is stable for discharge. Case will be discussed further between physicians.

## 2015-11-07 NOTE — Progress Notes (Signed)
Dr. Vianne Bulls is aware of transport. Everything has been filled out. Wife is still in the hospital and says she will be coming back around to his room in a little while, will update her then. [facility] called for transport and stated it would be at least 5 hours before they could pick the patient up. [facility] called and they can transport the patient to [facility]. Stated "when the first truck comes available he will be next". Also confirmed with EMS that it is okay patient does not have IV access since nothing is currently running. Per night shift RN in report this AM, stated patient does not have IV access and MD is aware and okay with it. [facility] is ready for transport, will call report to [facility].

## 2015-11-07 NOTE — Progress Notes (Addendum)
[place] at [facility]                                                                                                                                                                                            Patient Demographics   Jeffery Schneider, is a 70 y.o. male, DOB - 23-Dec-1945, [id number]  Admit date - 11/02/2015   Admitting Physician Nicholes Mango, MD  Outpatient Primary MD for the patient is [name], MD   LOS -   Subjective: Patient admitted with syncope. CT of the head did not show any acute changes. Patient is waiting for bed at [facility] , transfer initiated as per family request. Patient was pulling off the telemetry leads ,so we had the sitter since yesterday.  Review of Systems:   CONSTITUTIONAL: Due to dementia and unable to provide review of systems Able to answer some questions appropriately, denies chest pain, shortness of breath. No weakness of hands and legs. No nausea or vomiting. Able to eat for breakfast with assistance. Vitals:   Filed Vitals:   11/06/15 2001 11/06/15 2008 11/07/15 0021 11/07/15 0350  BP:  193/55 128/73 126/57  Pulse: 53 49 67 59  Temp: 98.3 F (36.8 C)   97.9 F (36.6 C)  TempSrc: Oral   Oral  Resp: 18   18  Height:      Weight:    82.192 kg (181 lb 3.2 oz)  SpO2: 97%   98%    Wt Readings from Last 3 Encounters:  11/07/15 82.192 kg (181 lb 3.2 oz)  10/30/15 85.276 kg (188 lb)  09/16/15 83.915 kg (185 lb)     Intake/Output Summary (Last 24 hours) at 11/07/15 1011 Last data filed at 11/07/15 0950  Gross per 24 hour  Intake    600 ml  Output      0 ml  Net    600 ml    Physical Exam:   GENERAL: Pleasant-appearing in no apparent distress.  HEAD, EYES, EARS, NOSE AND THROAT: Atraumatic, normocephalic. Extraocular muscles are intact. Pupils equal and reactive to light. Sclerae anicteric. No conjunctival injection. No oro-pharyngeal erythema.  NECK: Supple. There is no  jugular venous distention. No bruits, no lymphadenopathy, no thyromegaly.  HEART: Regular rate and rhythm,. No murmurs, no rubs, no clicks.  LUNGS: Clear to auscultation bilaterally. No rales or rhonchi. No wheezes.  ABDOMEN: Soft, flat, nontender, nondistended. Has good bowel sounds. No hepatosplenomegaly appreciated.  EXTREMITIES: No evidence of any cyanosis, clubbing, or peripheral edema.  +2 pedal and radial pulses bilaterally.  NEUROLOGIC: The patient is alert , has  some memory issues secondary to history of meningioma and hemorrhagic stroke.  SKIN: Moist and warm with no rashes appreciated.  Psych: Not anxious, depressed LN: No inguinal LN enlargement    Antibiotics   Anti-infectives    None      Medications   Scheduled Meds: . atorvastatin  40 mg Oral q1800  . docusate sodium  100 mg Oral BID  . finasteride  5 mg Oral Daily  . insulin aspart  0-9 Units Subcutaneous TID AC & HS  . insulin glargine  13 Units Subcutaneous QHS  . ketorolac  1 drop Both Eyes QID  . levETIRAcetam  1,000 mg Oral BID  . lubiprostone  24 mcg Oral Q breakfast  . nystatin   Topical BID  . pantoprazole  40 mg Oral Daily  . prednisoLONE acetate  1 drop Both Eyes QID  . sertraline  50 mg Oral QHS  . sodium chloride flush  3 mL Intravenous Q12H  . terazosin  2 mg Oral QHS   Continuous Infusions:  PRN Meds:.acetaminophen **OR** acetaminophen, hydrALAZINE, ondansetron **OR** ondansetron (ZOFRAN) IV, traZODone   Data Review:   Micro Results No results found for this or any previous visit (from the past 240 hour(s)).  Radiology Reports Dg Chest 2 View  11/02/2015  CLINICAL DATA:  Seizure. EXAM: CHEST  2 VIEW COMPARISON:  September 16, 2015. FINDINGS: Stable cardiomediastinal silhouette. Atherosclerosis of thoracic aorta is noted. No pneumothorax or pleural effusion is noted. Both lungs are clear. The visualized skeletal structures are unremarkable. IMPRESSION: No active cardiopulmonary disease.  Electronically Signed   By: Marijo Conception, M.D.   On: 11/02/2015 12:23   Ct Head Wo Contrast  11/02/2015  CLINICAL DATA:  Seizure today, history of seizures, syncope EXAM: CT HEAD WITHOUT CONTRAST TECHNIQUE: Contiguous axial images were obtained from the base of the skull through the vertex without intravenous contrast. COMPARISON:  09/17/2015 FINDINGS: Brain: No intracranial hemorrhage, mass effect or midline shift. Stable encephalomalacia from prior tumor resection in left frontal lobe. Ex vacuo dilatation of left frontal horn again noted. Ventricular size is stable from prior exam. No definite recurrent mass is noted on this unenhanced scan. Stable atrophy and chronic white matter disease. No definite acute cortical infarction. No intraventricular hemorrhage. Vascular: Extensive atherosclerotic calcifications of carotid siphon again noted. Skull: Again noted status post left frontal craniotomy. No skull fracture is noted. Sinuses/Orbits: The mastoid air cells and visualized sphenoid sinuses are unremarkable. Other: None IMPRESSION: No acute intracranial abnormality. Stable atrophy and chronic white matter disease. No definite acute cortical infarction. Stable postsurgical changes left frontal lobe. No definite recurrent mass is noted on this unenhanced scan. Electronically Signed   By: Lahoma Crocker M.D.   On: 11/02/2015 12:37   US Renal  11/05/2015  CLINICAL DATA:  Acute renal failure EXAM: RENAL / URINARY TRACT ULTRASOUND COMPLETE COMPARISON:  None. FINDINGS: Right Kidney: Length: 12.3 cm. Echogenicity within normal limits. No mass or hydronephrosis visualized. Left Kidney: Length: 11.8 cm. Echogenicity within normal limits. No mass or hydronephrosis visualized. Bladder: Appears normal for degree of bladder distention. The prostate is enlarged indenting upon the bladder inferiorly. IMPRESSION: No acute abnormality noted Electronically Signed   By: Inez Catalina M.D.   On: 11/05/2015 15:51      CBC  Recent Labs Lab 11/02/15 1127 11/03/15 0444  WBC 4.0 5.0  HGB 10.9* 10.1*  HCT 32.6* 29.4*  PLT 87* 89*  MCV 80.5 79.5*  MCH 26.9 27.4  MCHC 33.4 34.4  RDW 14.2 14.2  LYMPHSABS 0.9*  --   MONOABS 0.3  --   EOSABS 0.2  --   BASOSABS 0.0  --     Chemistries   Recent Labs Lab 11/02/15 1127 11/03/15 0444 11/04/15 0701  NA 144 146* 145  K 3.8 3.4* 3.5  CL 108 114* 113*  CO2 31 28 28   GLUCOSE 226* 124* 141*  BUN 22* 19 18  CREATININE 1.40* 1.27* 1.34*  CALCIUM 9.5 8.8* 9.1  AST 16 14*  --   ALT 13* 12*  --   ALKPHOS 73 63  --   BILITOT 0.5 0.5  --    ------------------------------------------------------------------------------------------------------------------ estimated creatinine clearance is 50.6 mL/min (by C-G formula based on Cr of 1.34). ------------------------------------------------------------------------------------------------------------------  Recent Labs  11/05/15 0458  HGBA1C 8.9*   ------------------------------------------------------------------------------------------------------------------ No results for input(s): CHOL, HDL, LDLCALC, TRIG, CHOLHDL, LDLDIRECT in the last 72 hours. ------------------------------------------------------------------------------------------------------------------ No results for input(s): TSH, T4TOTAL, T3FREE, THYROIDAB in the last 72 hours.  Invalid input(s): FREET3 ------------------------------------------------------------------------------------------------------------------ No results for input(s): VITAMINB12, FOLATE, FERRITIN, TIBC, IRON, RETICCTPCT in the last 72 hours.  Coagulation profile  Recent Labs Lab 11/02/15 1635  INR 1.07    No results for input(s): DDIMER in the last 72 hours.  Cardiac Enzymes  Recent Labs Lab 11/02/15 1127 11/02/15 1351  TROPONINI <0.03 <0.03    ------------------------------------------------------------------------------------------------------------------ Invalid input(s): POCBNP    Assessment & Plan   #syncope probably from dehydration / bradycardia  Suspect due to dehydration;Improved with hydration. Renal function improved with hydration. Patient appears euvolemic at this time. Seen by neurology  And cardiology. No further workup was recommended by both of them. We'll order ultrasound of carotids today after discussing with wife because of risk factors of hyperlipidemia, diabetes mellitus, hypertension Waiting for bed at [facility] Accepted by [facility], spoke with cardiology fellow yesterday morning.  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  They recommend evaluation for  Pacemaker. If pt has symptoms. Cardiology is following. Rate maintained about 70. But no pauses. Has sinus bradycardia with first-degree heart block. Patient was seen by cardiology the year before and the was the referred for pacemaker but the wife wanted second opinion so patient was taken to [facility] cardiology, seen by Dr. Cheri Fowler on February . 20th 2017 Echo normal this admission: Essential hypertension:  # dementia supportive care,  #History of seizure continue Keppra/history of meningioma, status post resection, history of hemorrhagic stroke: Aspirin, Lovenox discontinued due to his history. seen by neurology, no further neurological workup is recommended.  #hyperlipidemia; continue statins.  Acute renal failure on chronic renal failure: Improved with IV hydration.Renal function at baseline. Seen by nephrology renal ultrasound is within normal limits  #Physical therapy recommends home health physical therapy Got update from [facility] that the patient is still on  waiting list.  Diabetes mellitus type 2: Uncontrolled with hemoglobin A1c 8.9.  He  is on Levemir, sliding scale with coverage. Patient needs to be on low-sodium ADA diet           Start     Ordered   11/02/15 1651  Limited resuscitation (code)   Continuous    Question Answer Comment  In the event of cardiac or respiratory ARREST: Initiate Code Blue, Call Rapid Response Yes   In the event of cardiac or respiratory ARREST:  Perform CPR Yes   In the event of cardiac or respiratory ARREST: Perform Intubation/Mechanical Ventilation Yes   In the event of cardiac or respiratory ARREST: Use NIPPV/BiPAp only if indicated Yes   In the event of cardiac or respiratory ARREST: Administer ACLS medications if indicated Yes   In the event of cardiac or respiratory ARREST: Perform Defibrillation or Cardioversion if indicated No      11/02/15 1650    Code Status History    Date Active Date Inactive Code Status Order ID Comments User Context   11/23/2014  3:43 PM 11/24/2014  8:00 PM Full Code [id number]  Melton Alar, PA-C Inpatient    Advance Directive Documentation        Most Recent Value   Type of Advance Directive  Healthcare Power of Attorney   Pre-existing out of facility DNR order (yellow form or pink MOST form)     "MOST" Form in Place?             Consults  Cardiology  DVT Prophylaxis  due to history of hemorrhagic CVA Lovenox has been discontinued  Lab Results  Component Value Date   PLT 89* 11/03/2015    Already told dietary in instruction not give orange juice and coffee with patient. Time Spent in minutes   25 min  Greater than 50% of time spent in care coordination and counseling patient regarding the condition and plan of care.   Epifanio Lesches M.D on 11/07/2015 at 10:11 AM  Between 7am to 6pm - Pager - [phone]  After 6pm go to [email or web address] - password [email or web address] [place] [facility]   Office  [phone]

## 2015-11-13 ENCOUNTER — Emergency Department (HOSPITAL_COMMUNITY): Payer: Medicare HMO

## 2015-11-13 ENCOUNTER — Encounter (HOSPITAL_COMMUNITY): Payer: Self-pay | Admitting: *Deleted

## 2015-11-13 ENCOUNTER — Emergency Department (HOSPITAL_COMMUNITY)
Admission: EM | Admit: 2015-11-13 | Discharge: 2015-11-13 | Disposition: A | Discharge status: Home / Self Care | Payer: Medicare HMO | Attending: Emergency Medicine | Admitting: Emergency Medicine

## 2015-11-13 DIAGNOSIS — I129 Hypertensive chronic kidney disease with stage 1 through stage 4 chronic kidney disease, or unspecified chronic kidney disease: Secondary | ICD-10-CM | POA: Insufficient documentation

## 2015-11-13 DIAGNOSIS — Z794 Long term (current) use of insulin: Secondary | ICD-10-CM | POA: Diagnosis not present

## 2015-11-13 DIAGNOSIS — R519 Headache, unspecified: Secondary | ICD-10-CM

## 2015-11-13 DIAGNOSIS — R5383 Other fatigue: Secondary | ICD-10-CM | POA: Insufficient documentation

## 2015-11-13 DIAGNOSIS — E1122 Type 2 diabetes mellitus with diabetic chronic kidney disease: Secondary | ICD-10-CM | POA: Insufficient documentation

## 2015-11-13 DIAGNOSIS — K219 Gastro-esophageal reflux disease without esophagitis: Secondary | ICD-10-CM | POA: Insufficient documentation

## 2015-11-13 DIAGNOSIS — Z862 Personal history of diseases of the blood and blood-forming organs and certain disorders involving the immune mechanism: Secondary | ICD-10-CM | POA: Insufficient documentation

## 2015-11-13 DIAGNOSIS — N189 Chronic kidney disease, unspecified: Secondary | ICD-10-CM | POA: Diagnosis not present

## 2015-11-13 DIAGNOSIS — Z79899 Other long term (current) drug therapy: Secondary | ICD-10-CM | POA: Diagnosis not present

## 2015-11-13 DIAGNOSIS — Z880 Allergy status to penicillin: Secondary | ICD-10-CM | POA: Insufficient documentation

## 2015-11-13 DIAGNOSIS — Z8719 Personal history of other diseases of the digestive system: Secondary | ICD-10-CM | POA: Diagnosis not present

## 2015-11-13 DIAGNOSIS — R600 Localized edema: Secondary | ICD-10-CM | POA: Insufficient documentation

## 2015-11-13 DIAGNOSIS — R630 Anorexia: Secondary | ICD-10-CM | POA: Insufficient documentation

## 2015-11-13 DIAGNOSIS — Z9889 Other specified postprocedural states: Secondary | ICD-10-CM | POA: Insufficient documentation

## 2015-11-13 DIAGNOSIS — F0390 Unspecified dementia without behavioral disturbance: Secondary | ICD-10-CM | POA: Diagnosis not present

## 2015-11-13 DIAGNOSIS — N400 Enlarged prostate without lower urinary tract symptoms: Secondary | ICD-10-CM | POA: Diagnosis not present

## 2015-11-13 DIAGNOSIS — R51 Headache: Secondary | ICD-10-CM | POA: Insufficient documentation

## 2015-11-13 LAB — BASIC METABOLIC PANEL
Anion gap: 7 (ref 5–15)
BUN: 17 mg/dL (ref 6–20)
CHLORIDE: 108 mmol/L (ref 101–111)
CO2: 29 mmol/L (ref 22–32)
CREATININE: 1.39 mg/dL — AB (ref 0.61–1.24)
Calcium: 9.7 mg/dL (ref 8.9–10.3)
GFR calc non Af Amer: 49 mL/min — ABNORMAL LOW (ref >60)
GFR, EST AFRICAN AMERICAN: 57 mL/min — AB (ref >60)
Glucose, Bld: 309 mg/dL — ABNORMAL HIGH (ref 65–99)
Potassium: 4.1 mmol/L (ref 3.5–5.1)
Sodium: 144 mmol/L (ref 135–145)

## 2015-11-13 LAB — I-STAT TROPONIN, ED: Troponin i, poc: 0.01 ng/mL (ref 0.00–0.08)

## 2015-11-13 LAB — CBC
HCT: 33.7 % — ABNORMAL LOW (ref 39.0–52.0)
HEMOGLOBIN: 10.9 g/dL — AB (ref 13.0–17.0)
MCH: 26 pg (ref 26.0–34.0)
MCHC: 32.3 g/dL (ref 30.0–36.0)
MCV: 80.2 fL (ref 78.0–100.0)
PLATELETS: 121 10*3/uL — AB (ref 150–400)
RBC: 4.2 MIL/uL — AB (ref 4.22–5.81)
RDW: 13.8 % (ref 11.5–15.5)
WBC: 5.1 10*3/uL (ref 4.0–10.5)

## 2015-11-13 LAB — URINE MICROSCOPIC-ADD ON

## 2015-11-13 LAB — CBG MONITORING, ED: Glucose-Capillary: 340 mg/dL — ABNORMAL HIGH (ref 65–99)

## 2015-11-13 LAB — I-STAT CG4 LACTIC ACID, ED: Lactic Acid, Venous: 0.88 mmol/L (ref 0.5–2.0)

## 2015-11-13 LAB — URINALYSIS, ROUTINE W REFLEX MICROSCOPIC
Bilirubin Urine: NEGATIVE
Ketones, ur: 15 mg/dL — AB
Nitrite: NEGATIVE
Protein, ur: 30 mg/dL — AB
SPECIFIC GRAVITY, URINE: 1.023 (ref 1.005–1.030)
pH: 5.5 (ref 5.0–8.0)

## 2015-11-13 MED ORDER — INSULIN ASPART 100 UNIT/ML ~~LOC~~ SOLN
5.0000 [IU] | Freq: Once | SUBCUTANEOUS | Status: AC
  Administered 2015-11-13: 5 [IU] via SUBCUTANEOUS
  Filled 2015-11-13: qty 1

## 2015-11-13 NOTE — ED Notes (Signed)
PT becoming increasingly agitated, taking off clothes, taking himself off the monitor, etc.

## 2015-11-13 NOTE — ED Notes (Addendum)
Pt arrived by [facility]. Pt reports onset of headache this am. Recently admitted to [facility] following syncopal episode/loop recorder placement. Denies vision changes. Hx of stroke with right side deficits. Per family member, pt has not been acting like his normal self for several days. Has noted an increase in weakness, difficulty getting around, increase in difficulty communicating with family (hx of dementia)

## 2015-11-13 NOTE — Discharge Instructions (Signed)

## 2015-11-13 NOTE — Care Management (Signed)
Patient was brought to [facility] ED with c/o headache as per [facility] recommendations. ED CM met with patient and wife at bedside, wife reports patient was recently discharged from [facility] 5/19 after a syncopal episode with [facility] services with [employer], HHRN/PT/OT  and Aide furnished by the [place] x2 hrs per day. Patient has medical equipment at home. Hx of stroke with right side deficits. Per wife patient has not been acting normal with increased weakness and difficulty communicating. ED evaluation still in progress. CM spokw with HHRN Linda to confirm nurse will be out to see patient tomorrow if medically cleared for discharge. CM will continue to follow for transitional care plan.

## 2015-11-13 NOTE — ED Provider Notes (Signed)
CSN: [id number]     Arrival date & time 11/13/15  1233 History   First MD Initiated Contact with Patient 11/13/15 1607     Chief Complaint  Patient presents with  . Headache    HPI  Elderly male with history of HTN, DM, meningioma status post resection, dementia, CK D, history of seizures who presents to ED for evaluation declining functionality in the setting of headaches. History obtained from wife. He was just discharged from [facility] for syncope of unclear etiology. Initially was [employer] however troponin was positive was deemed to be a lab error. He was given DVT prophylaxis and the wife is concerned that this may have caused a hemorrhagic stroke. He has history of hemorrhagic stroke. There's been no recent falls or head trauma. Wife believes he has been more somnolent and less interactive. He has become less interactive. Wise decline on review of historical features. There's been no vomiting. No bowel pain. The headache is worse with lying flat. His speech is been unchanged. No change in urinary output. No diarrhea. No fevers no cough or sputum. He is able him the record but has needed increased assistence. Has home health nurse visits frequently.    Past Medical History  Diagnosis Date  . Hypertension   . Diabetes mellitus without complication ([place])   . Brain tumor ([place])   . Stroke ([place])   . Kidney disease   . Hyperlipidemia   . Bradycardia   . Syncope   . Abnormal EKG   . Dementia   . GERD (gastroesophageal reflux disease)   . Dysphagia   . Diabetes ([place])   . Constipation   . Seizures ([place])   . Sickle cell trait ([place])   . Benign prostatic hyperplasia   . Chronic kidney disease    Past Surgical History  Procedure Laterality Date  . Cardiac catheterization    . Craniotomy for tumor      frontal lobe   Family History  Problem Relation Age of Onset  . Heart attack Mother   . Heart attack Father   . Thyroid cancer Sister   . Diabetes Sister   . Heart attack Brother     Social History  Substance Use Topics  . Smoking status: Never Smoker   . Smokeless tobacco: None  . Alcohol Use: No    Review of Systems  Unable to perform ROS: Dementia  Constitutional: Positive for activity change, appetite change and fatigue. Negative for fever.  HENT: Negative for congestion.   Eyes: Negative for visual disturbance.  Respiratory: Negative for shortness of breath.   Cardiovascular: Negative for chest pain and leg swelling.  Gastrointestinal: Negative for nausea, vomiting and abdominal pain.  Endocrine: Negative for polyuria.  Genitourinary: Negative for frequency, decreased urine volume and difficulty urinating.  Skin: Negative for wound.  Neurological: Positive for headaches. Negative for light-headedness.  Psychiatric/Behavioral: Positive for behavioral problems and confusion. Negative for hallucinations. The patient is not hyperactive.       Allergies  Salmon; Ace inhibitors; Calcitonin; Coffee bean extract; Influenza vaccines; Lisinopril; Loratadine; Penicillins; Sulfa antibiotics; Actos; Codeine; and Streptomycin  Home Medications   Prior to Admission medications   Medication Sig Start Date End Date Taking? Authorizing Provider  finasteride (PROSCAR) 5 MG tablet Take 5 mg by mouth daily. 11/17/14  Yes Historical Provider, MD  insulin aspart (NOVOLOG) 100 UNIT/ML injection Inject 6-7 Units into the skin 3 (three) times daily before meals. Sliding scale: 151-200=1 unit, 201-250=2 unit, 251-300=3 units, 301-350=4 units,  351-400=5 units, over 400 call dr   Yes Historical Provider, MD  insulin glargine (LANTUS) 100 UNIT/ML injection Inject 14 Units into the skin at bedtime.    Yes Historical Provider, MD  levETIRAcetam (KEPPRA) 1000 MG tablet Take 1,000 mg by mouth 2 (two) times daily.    Yes Historical Provider, MD  lubiprostone (AMITIZA) 24 MCG capsule Take 24 mcg by mouth daily with breakfast.    Yes Historical Provider, MD  Melatonin 2.5 MG CAPS Take 5  mg by mouth daily.    Yes Historical Provider, MD  pantoprazole (PROTONIX) 40 MG tablet Take 40 mg by mouth daily.   Yes Historical Provider, MD  sertraline (ZOLOFT) 100 MG tablet Take 100 mg by mouth daily.    Yes Historical Provider, MD  terazosin (HYTRIN) 2 MG capsule Take 2 mg by mouth at bedtime.   Yes Historical Provider, MD  traZODone (DESYREL) 50 MG tablet Take 50 mg by mouth at bedtime as needed for sleep.   Yes Historical Provider, MD   BP 173/61 mmHg  Pulse 102  Temp(Src) 97.8 F (36.6 C) (Oral)  Resp 19  SpO2 100% Physical Exam  Constitutional: No distress.  Chronically ill appearing, elderly  HENT:  Head: Normocephalic and atraumatic.  Nose: Nose normal.  Eyes: Conjunctivae are normal.  Pupils 70mm equal and reactive, no nystagmus. EOMI. Tracks  Neck: Normal range of motion. Neck supple. No tracheal deviation present.  No bruit  Cardiovascular: Regular rhythm and normal heart sounds.   No murmur heard. HR 55-60  Pulmonary/Chest: Effort normal and breath sounds normal. No respiratory distress. He has no wheezes. He has no rales.  Abdominal: Soft. Bowel sounds are normal. He exhibits no distension and no mass. There is no tenderness. There is no rebound.  Musculoskeletal: Normal range of motion. He exhibits edema (trace ankles minimal).  No lower extremity edema, calf tenderness, warmth, erythema or palpable cords   Neurological: He is alert.  Oriented to person (not time or place).  MAE.  Follows commands in all extrems.  Answers most simple questions appropriately.  Speech delayed, not slurred. CN 3-12 normal, no droop.  Increased tone throughout  Skin: Skin is warm and dry. No rash noted.  Psychiatric: He has a normal mood and affect.  Nursing note and vitals reviewed.   ED Course  Procedures (including critical care time) Labs Review Labs Reviewed  BASIC METABOLIC PANEL - Abnormal; Notable for the following:    Glucose, Bld 309 (*)    Creatinine, Ser 1.39 (*)     GFR calc non Af Amer 49 (*)    GFR calc Af Amer 57 (*)    All other components within normal limits  CBC - Abnormal; Notable for the following:    RBC 4.20 (*)    Hemoglobin 10.9 (*)    HCT 33.7 (*)    Platelets 121 (*)    All other components within normal limits  URINALYSIS, ROUTINE W REFLEX MICROSCOPIC (NOT AT [facility]) - Abnormal; Notable for the following:    APPearance HAZY (*)    Glucose, UA >1000 (*)    Hgb urine dipstick LARGE (*)    Ketones, ur 15 (*)    Protein, ur 30 (*)    Leukocytes, UA TRACE (*)    All other components within normal limits  URINE MICROSCOPIC-ADD ON - Abnormal; Notable for the following:    Squamous Epithelial / LPF 0-5 (*)    Bacteria, UA RARE (*)    All other  components within normal limits  CBG MONITORING, ED - Abnormal; Notable for the following:    Glucose-Capillary 340 (*)    All other components within normal limits  I-STAT TROPOININ, ED  I-STAT CG4 LACTIC ACID, ED    Imaging Review Dg Chest 2 View  11/13/2015  CLINICAL DATA:  Cough and altered mental status x 1 day EXAM: CHEST  2 VIEW COMPARISON:  None FINDINGS: Lateral view mildly oblique with limitations secondary to arm position. Patient rotated right on the frontal. Midline trachea. Mild cardiomegaly with transverse aortic atherosclerosis. No pleural effusion or pneumothorax. No congestive failure. Clear lungs. IMPRESSION: No acute cardiopulmonary disease. Cardiomegaly with aortic atherosclerosis. Electronically Signed   By: Abigail Miyamoto M.D.   On: 11/13/2015 18:16   Ct Head Wo Contrast  11/13/2015  CLINICAL DATA:  Acute onset of headache. Generalized weakness and difficulty moving. Initial encounter. EXAM: CT HEAD WITHOUT CONTRAST TECHNIQUE: Contiguous axial images were obtained from the base of the skull through the vertex without intravenous contrast. COMPARISON:  CT of the head performed 04/14/2015 FINDINGS: There is no evidence of acute infarction, mass lesion, or intra- or extra-axial  hemorrhage on CT. Prominence of the ventricles and sulci reflects moderate cortical volume loss. Postoperative change is noted at the left frontal lobe, with associated encephalomalacia. Mild periventricular white matter change likely reflects small vessel ischemic microangiopathy. The brainstem and fourth ventricle are within normal limits. The basal ganglia are unremarkable in appearance. The cerebral hemispheres demonstrate grossly normal gray-white differentiation. No mass effect or midline shift is seen. A large frontal craniotomy flap is noted. The orbits are within normal limits. The paranasal sinuses and mastoid air cells are well-aerated. No significant soft tissue abnormalities are seen. IMPRESSION: 1. No acute intracranial pathology seen on CT. 2. Moderate cortical volume loss and scattered small vessel ischemic microangiopathy. 3. Postoperative change at the left frontal lobe, with associated encephalomalacia. Electronically Signed   By: Garald Balding M.D.   On: 11/13/2015 20:07   I have personally reviewed and evaluated these images and lab results as part of my medical decision-making.   EKG Interpretation   Date/Time:  Monday Nov 13 2015 12:58:50 EDT Ventricular Rate:  67 PR Interval:  176 QRS Duration: 84 QT Interval:  404 QTC Calculation: 426 R Axis:   -32 Text Interpretation:  Normal sinus rhythm Left axis deviation Abnormal ECG  No significant change since last tracing Confirmed by YAO  MD, DAVID  ([id number]) on 11/13/2015 4:29:38 PM      MDM   Final diagnoses:  Acute nonintractable headache, unspecified headache type  Dementia, without behavioral disturbance   Recent hospital course reviewed. Syncope of undetermined etiology at prior visit, no recurrence. EKG without dysrhythmia, WPW, [place] or ST segment changes. First degree heart block noted toda. Electrolytes wnl. No new kidney dz.  Hyperglycemic, not in DKA.  Wife is concerned because he has had a step wise declined  over a long period of time.  CT without evidence of hemorrhage, or new mass. Chronic volume loss apparent.  His current mental state appears to be at baseline on detailed review with wife at bedside. UA wo infx markers.  No systemic infx sx per HPI or exam. CXR wo consolidation.  He is asymptomatic.  Likely stepwise decline related to progressive dementia course, could be related to vascular dementia.  Case manager has long discussion with wife regarding home health needs and placement options.  Wife would like pt to return home, does not request admission,  feels comfortable taking care of him at home.  Will have home health visit in AM.  See PCP this week.  Counseled on strict return precautions for worsening of condition.       Tammy Sours, MD 11/13/15 [id number]  Wandra Arthurs, MD 11/15/15 2002

## 2015-12-05 ENCOUNTER — Ambulatory Visit: Payer: Medicare HMO | Admitting: Neurology

## 2016-02-23 DEATH — deceased

## 2017-06-29 IMAGING — CR DG CHEST 2V
2 series · 2 of 2 positions shown · non-contrast
Comparison: September 16, 2015.

CLINICAL DATA: Seizure.

EXAM:
CHEST  2 VIEW

[chest lat]
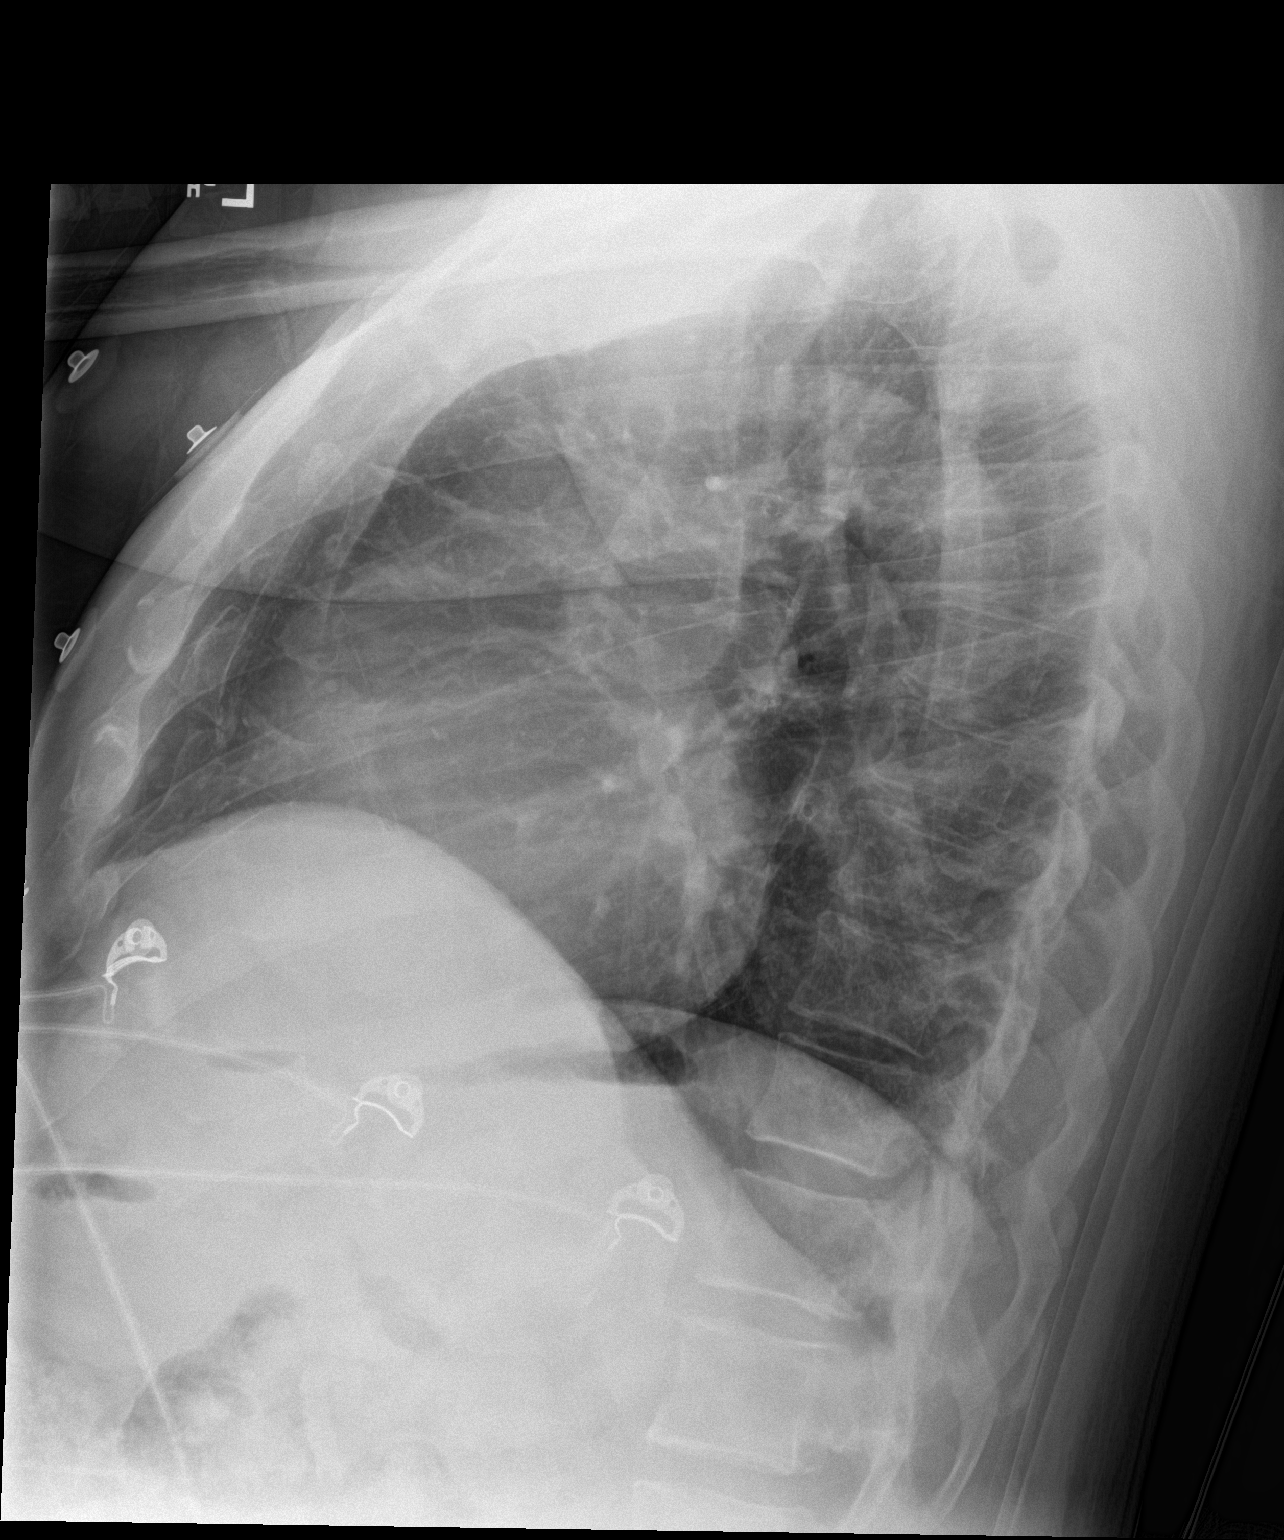

[chest ap]
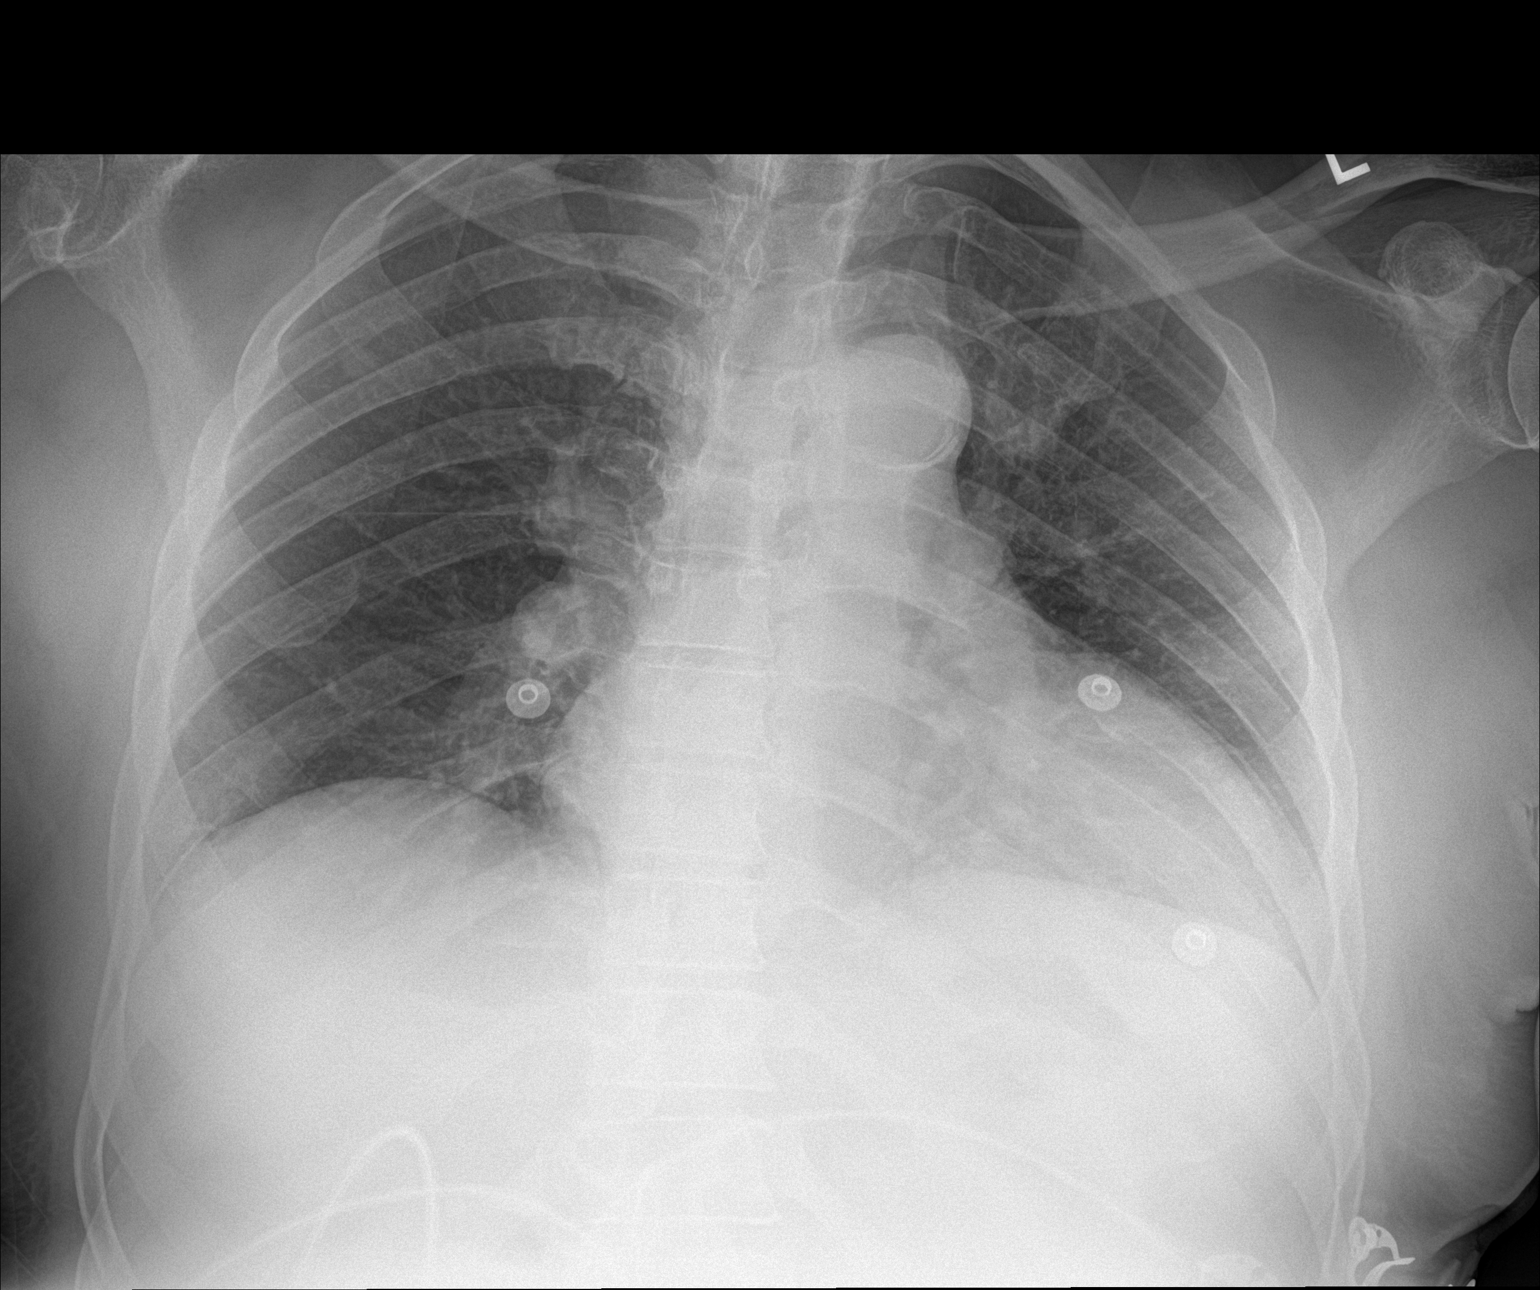

[2 of 2 positions shown; findings below may reference images not displayed]

FINDINGS: Stable cardiomediastinal silhouette. Atherosclerosis of thoracic
aorta is noted. No pneumothorax or pleural effusion is noted. Both
lungs are clear. The visualized skeletal structures are
unremarkable.
IMPRESSION: No active cardiopulmonary disease.

## 2017-06-29 IMAGING — CT CT HEAD W/O CM
1 series · 15 of 30 positions shown, 19 images · non-contrast
Comparison: 09/17/2015

CLINICAL DATA: Seizure today, history of seizures, syncope

EXAM:
CT HEAD WITHOUT CONTRAST
TECHNIQUE: Contiguous axial images were obtained from the base of the skull
through the vertex without intravenous contrast.

[Series 2: head wo · axial · 0.43mm/px · z∈[-61,+92]mm · 15 of 36 slices shown, 19 images]
[im 2/36  brain]
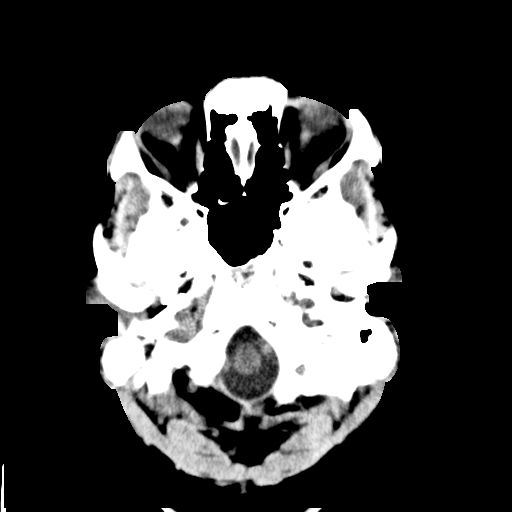
[im 2/36  bone]
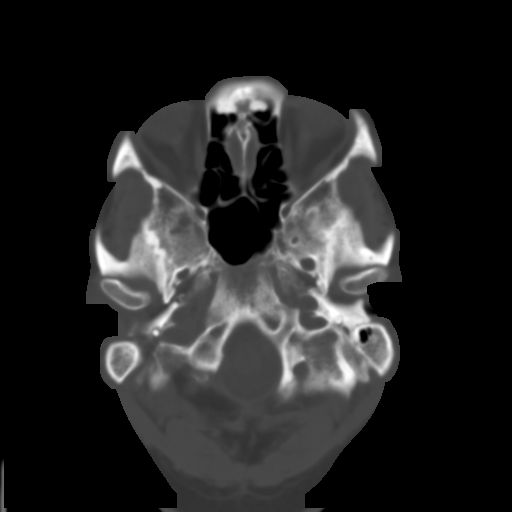
[im 4/36  brain]
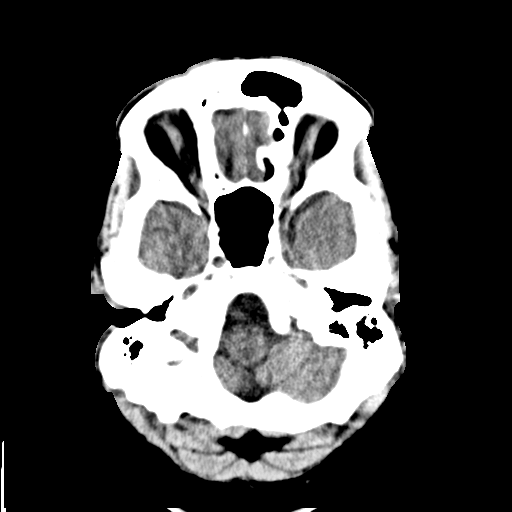
[im 7/36  brain]
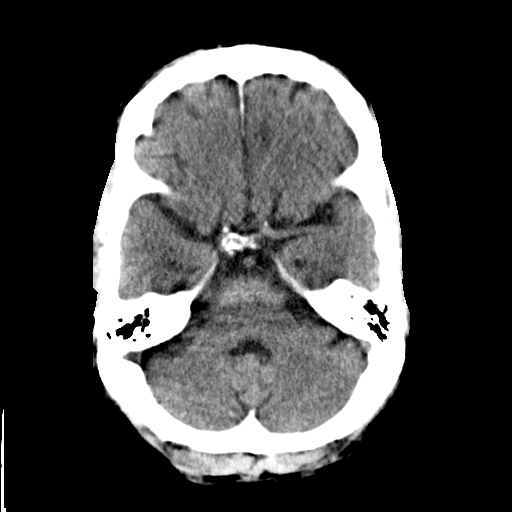
[im 9/36  brain]
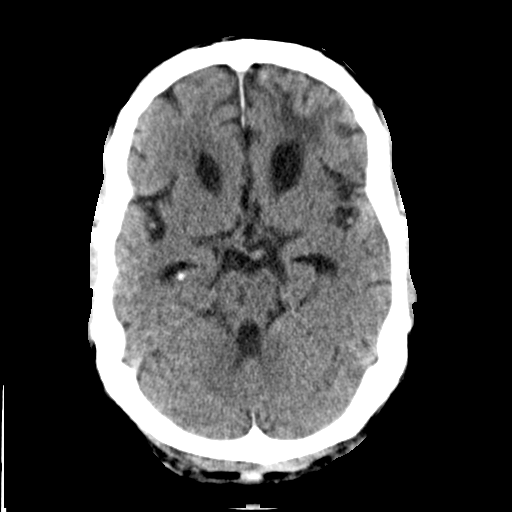
[im 11/36  brain]
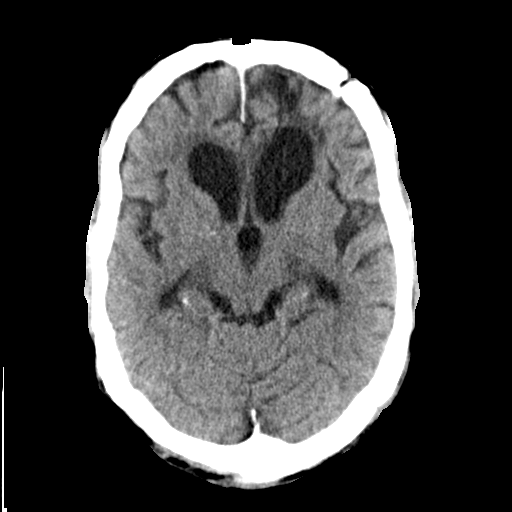
[im 11/36  bone]
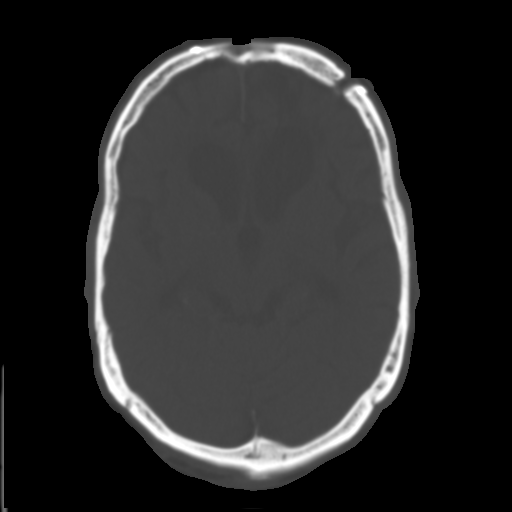
[im 14/36  brain]
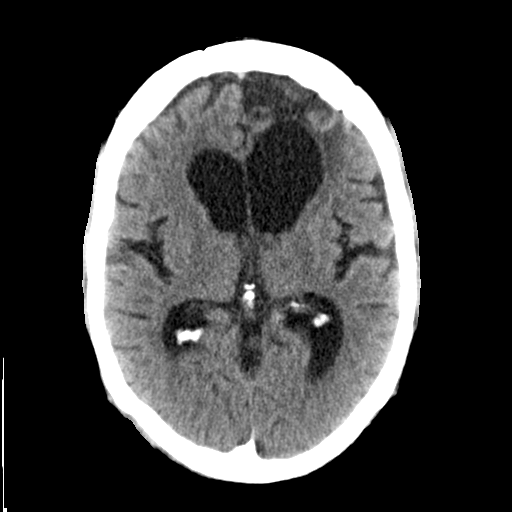
[im 16/36  brain]
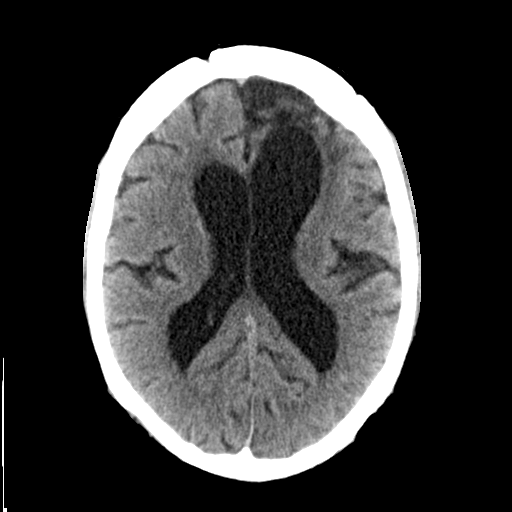
[im 19/36  brain]
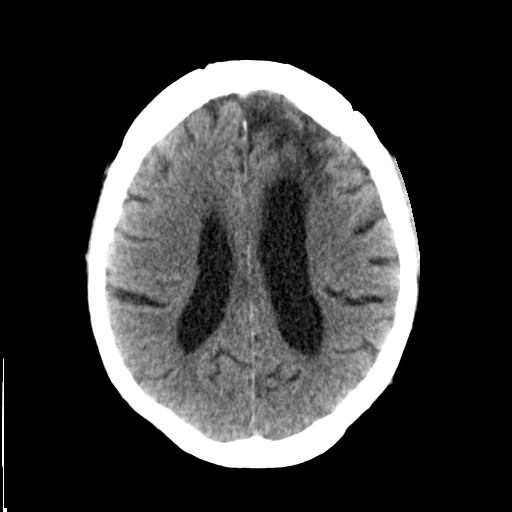
[im 20/36  brain]
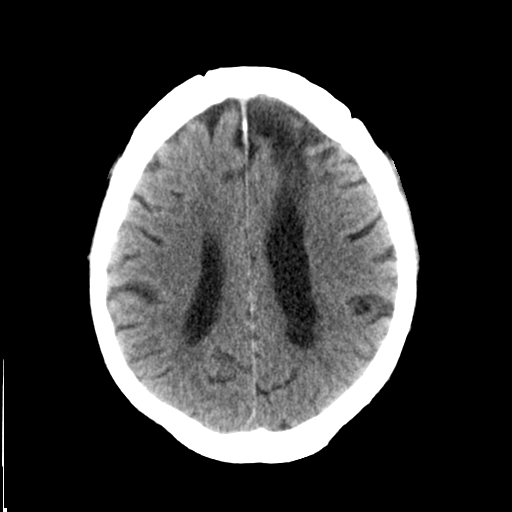
[im 20/36  bone]
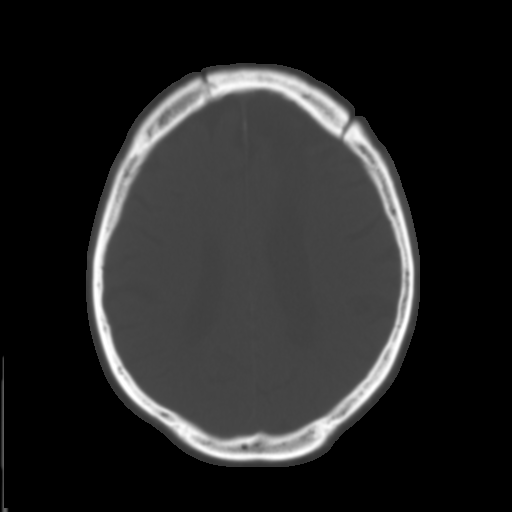
[im 22/36  brain]
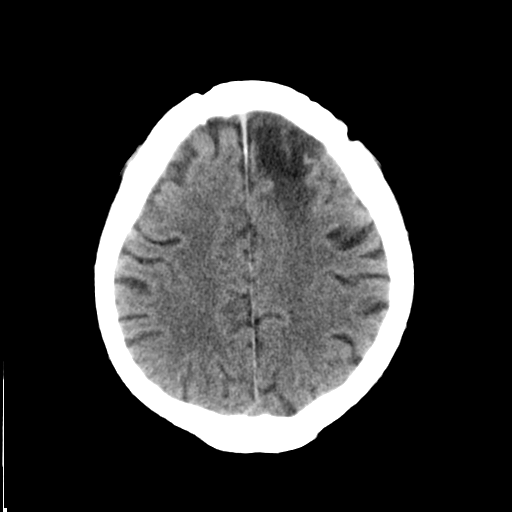
[im 25/36  brain]
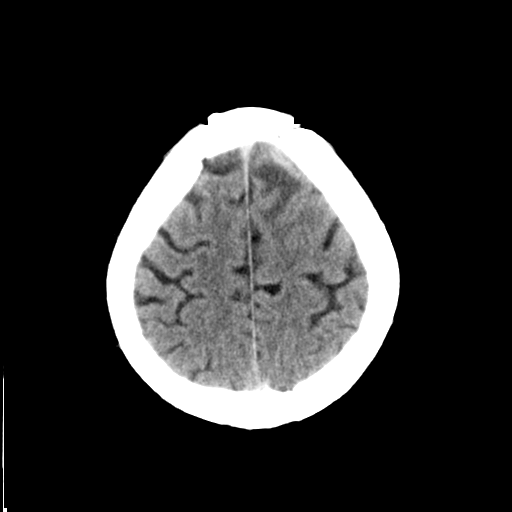
[im 27/36  brain]
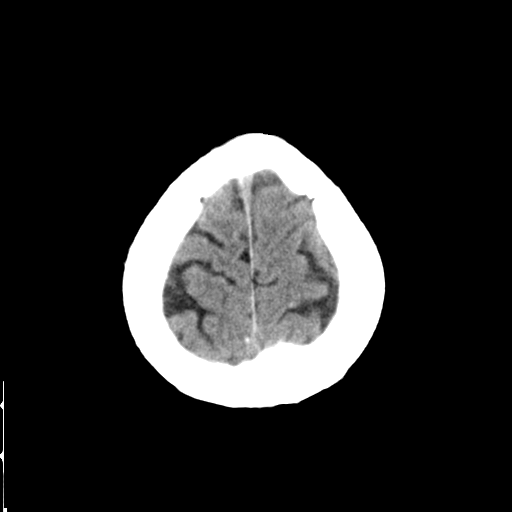
[im 29/36  brain]
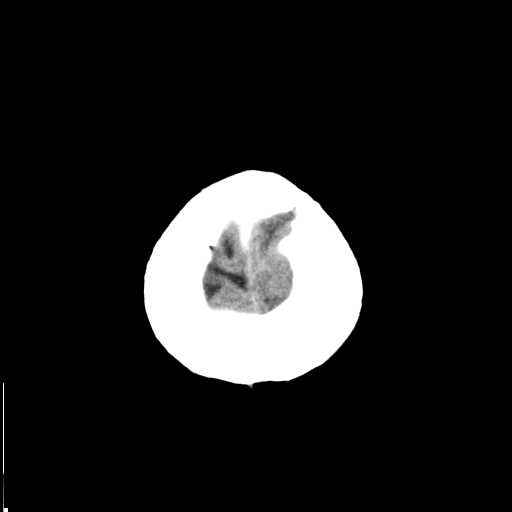
[im 29/36  bone]
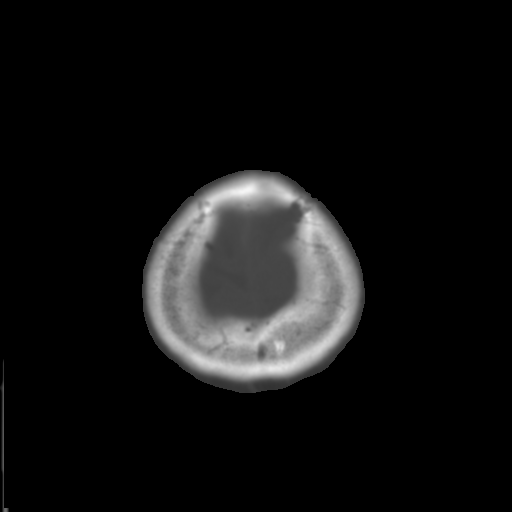
[im 32/36  brain]
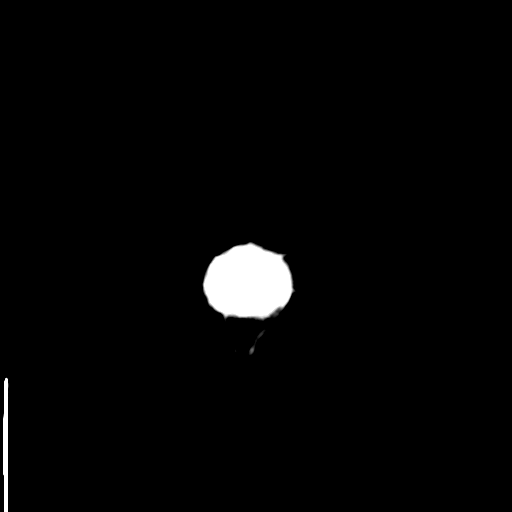
[im 34/36  brain]
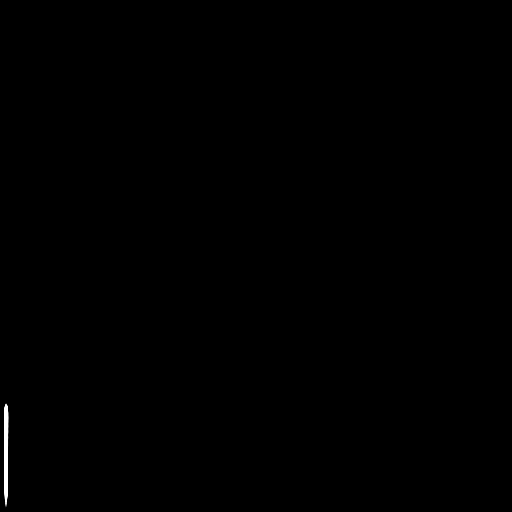

[15 of 30 positions shown; findings below may reference images not displayed]

FINDINGS: Brain: No intracranial hemorrhage, mass effect or midline shift.
Stable encephalomalacia from prior tumor resection in left frontal
lobe. Ex vacuo dilatation of left frontal horn again noted.
Ventricular size is stable from prior exam. No definite recurrent
mass is noted on this unenhanced scan. Stable atrophy and chronic
white matter disease. No definite acute cortical infarction. No
intraventricular hemorrhage.

Vascular: Extensive atherosclerotic calcifications of carotid siphon
again noted.

Skull: Again noted status post left frontal craniotomy. No skull
fracture is noted.

Sinuses/Orbits: The mastoid air cells and visualized sphenoid
sinuses are unremarkable.

Other: None
IMPRESSION: No acute intracranial abnormality. Stable atrophy and chronic white
matter disease. No definite acute cortical infarction. Stable
postsurgical changes left frontal lobe. No definite recurrent mass
is noted on this unenhanced scan.
